# Patient Record
Sex: Male | Born: 1965 | Race: White | Hispanic: No | Marital: Married | Smoking: Never smoker
Health system: Southern US, Community
[De-identification: ages and names within clinical notes are randomized; demographics above are authoritative.]

## PROBLEM LIST (undated history)

## (undated) DIAGNOSIS — J302 Other seasonal allergic rhinitis: Secondary | ICD-10-CM

## (undated) DIAGNOSIS — R030 Elevated blood-pressure reading, without diagnosis of hypertension: Secondary | ICD-10-CM

## (undated) DIAGNOSIS — C4491 Basal cell carcinoma of skin, unspecified: Secondary | ICD-10-CM

## (undated) DIAGNOSIS — Z8619 Personal history of other infectious and parasitic diseases: Secondary | ICD-10-CM

## (undated) DIAGNOSIS — R51 Headache: Secondary | ICD-10-CM

## (undated) DIAGNOSIS — R519 Headache, unspecified: Secondary | ICD-10-CM

## (undated) DIAGNOSIS — K219 Gastro-esophageal reflux disease without esophagitis: Secondary | ICD-10-CM

## (undated) DIAGNOSIS — K648 Other hemorrhoids: Secondary | ICD-10-CM

## (undated) DIAGNOSIS — I509 Heart failure, unspecified: Secondary | ICD-10-CM

## (undated) DIAGNOSIS — I341 Nonrheumatic mitral (valve) prolapse: Secondary | ICD-10-CM

## (undated) DIAGNOSIS — C801 Malignant (primary) neoplasm, unspecified: Secondary | ICD-10-CM

## (undated) DIAGNOSIS — K5790 Diverticulosis of intestine, part unspecified, without perforation or abscess without bleeding: Secondary | ICD-10-CM

## (undated) DIAGNOSIS — M1990 Unspecified osteoarthritis, unspecified site: Secondary | ICD-10-CM

## (undated) HISTORY — DX: Elevated blood-pressure reading, without diagnosis of hypertension: R03.0

## (undated) HISTORY — DX: Headache, unspecified: R51.9

## (undated) HISTORY — DX: Nonrheumatic mitral (valve) prolapse: I34.1

## (undated) HISTORY — DX: Malignant (primary) neoplasm, unspecified: C80.1

## (undated) HISTORY — DX: Other hemorrhoids: K64.8

## (undated) HISTORY — DX: Gastro-esophageal reflux disease without esophagitis: K21.9

## (undated) HISTORY — DX: Diverticulosis of intestine, part unspecified, without perforation or abscess without bleeding: K57.90

## (undated) HISTORY — DX: Personal history of other infectious and parasitic diseases: Z86.19

## (undated) HISTORY — PX: COLONOSCOPY: SHX174

## (undated) HISTORY — PX: WISDOM TOOTH EXTRACTION: SHX21

## (undated) HISTORY — PX: EYE SURGERY: SHX253

## (undated) HISTORY — DX: Other seasonal allergic rhinitis: J30.2

## (undated) HISTORY — DX: Headache: R51

## (undated) HISTORY — DX: Unspecified osteoarthritis, unspecified site: M19.90

## (undated) HISTORY — DX: Basal cell carcinoma of skin, unspecified: C44.91

---

## 2015-05-30 HISTORY — PX: BASAL CELL CARCINOMA EXCISION: SHX1214

## 2017-06-25 ENCOUNTER — Other Ambulatory Visit: Payer: Self-pay

## 2017-06-25 ENCOUNTER — Ambulatory Visit: Payer: BLUE CROSS/BLUE SHIELD | Admitting: Physician Assistant

## 2017-06-25 ENCOUNTER — Encounter: Payer: Self-pay | Admitting: Physician Assistant

## 2017-06-25 ENCOUNTER — Telehealth: Payer: Self-pay | Admitting: Physician Assistant

## 2017-06-25 VITALS — BP 142/80 | HR 86 | Temp 97.8°F | Ht 72.05 in | Wt 244.0 lb

## 2017-06-25 DIAGNOSIS — J029 Acute pharyngitis, unspecified: Secondary | ICD-10-CM

## 2017-06-25 DIAGNOSIS — R6889 Other general symptoms and signs: Secondary | ICD-10-CM | POA: Diagnosis not present

## 2017-06-25 DIAGNOSIS — R05 Cough: Secondary | ICD-10-CM | POA: Diagnosis not present

## 2017-06-25 DIAGNOSIS — R509 Fever, unspecified: Secondary | ICD-10-CM | POA: Diagnosis not present

## 2017-06-25 DIAGNOSIS — R059 Cough, unspecified: Secondary | ICD-10-CM

## 2017-06-25 LAB — POC INFLUENZA A&B (BINAX/QUICKVUE)
Influenza A, POC: NEGATIVE
Influenza B, POC: NEGATIVE

## 2017-06-25 MED ORDER — BENZONATATE 100 MG PO CAPS: 100.0000 mg | ORAL_CAPSULE | Freq: Three times a day (TID) | ORAL | 0 refills | Status: DC | PRN

## 2017-06-25 NOTE — Telephone Encounter (Signed)
Contacted pt to make sure medication was sent to the correct pharmacy. Pt verified that medication was sent to the correct pharmacy. Notified pt that the pharmacy would be in contact when the medication was ready for pick up. Pt verbalized understanding.   [place] to confirm receipt of prescription. Pharmacy states that the prescription was just received.

## 2017-06-25 NOTE — Patient Instructions (Addendum)
Your in house flu test was negative but your symptoms are consistent with the flu. You are contagious until you are fever free for 24 hours without using tylenol or ibuprofen.I recommend continuing with your daily medication regimen for symptomatic relief. I have also given you a Rx for tessalon perlese to help with cough suppression. Two major complications after the flu are pneumonia and sinus infections. Please be aware of this and if you are not any better in 7-10 days or you develop worsening cough or sinus pressure, seek care at our clinic or the ED. Continue to wash your hands and wear a mask daily especially around other people.     Influenza, Adult Influenza ("the flu") is an infection in the lungs, nose, and throat (respiratory tract). It is caused by a virus. The flu causes many common cold symptoms, as well as a high fever and body aches. It can make you feel very sick. The flu spreads easily from person to person (is contagious). Getting a flu shot (influenza vaccination) every year is the best way to prevent the flu. Follow these instructions at home:  Take over-the-counter and prescription medicines only as told by your doctor.  Use a cool mist humidifier to add moisture (humidity) to the air in your home. This can make it easier to breathe.  Rest as needed.  Drink enough fluid to keep your pee (urine) clear or pale yellow.  Cover your mouth and nose when you cough or sneeze.  Wash your hands with soap and water often, especially after you cough or sneeze. If you cannot use soap and water, use hand sanitizer.  Stay home from work or school as told by your doctor. Unless you are visiting your doctor, try to avoid leaving home until your fever has been gone for 24 hours without the use of medicine.  Keep all follow-up visits as told by your doctor. This is important. How is this prevented?  Getting a yearly (annual) flu shot is the best way to avoid getting the flu. You may get  the flu shot in late summer, fall, or winter. Ask your doctor when you should get your flu shot.  Wash your hands often or use hand sanitizer often.  Avoid contact with people who are sick during cold and flu season.  Eat healthy foods.  Drink plenty of fluids.  Get enough sleep.  Exercise regularly. Contact a doctor if:  You get new symptoms.  You have: ? Chest pain. ? Watery poop (diarrhea). ? A fever.  Your cough gets worse.  You start to have more mucus.  You feel sick to your stomach (nauseous).  You throw up (vomit). Get help right away if:  You start to be short of breath or have trouble breathing.  Your skin or nails turn a bluish color.  You have very bad pain or stiffness in your neck.  You get a sudden headache.  You get sudden pain in your face or ear.  You cannot stop throwing up. This information is not intended to replace advice given to you by your health care provider. Make sure you discuss any questions you have with your health care provider. Document Released: 02/22/2008 Document Revised: 10/21/2015 Document Reviewed: 03/09/2015 Elsevier Interactive Patient Education  2017 [employer].   IF you received an x-ray today, you will receive an invoice from [facility]. Please contact [facility] at [phone] with questions or concerns regarding your invoice.   IF you received labwork today,  you will receive an invoice from [facility]. Please contact [facility] at [phone] with questions or concerns regarding your invoice.   Our billing staff will not be able to assist you with questions regarding bills from these companies.  You will be contacted with the lab results as soon as they are available. The fastest way to get your results is to activate your My Chart account. Instructions are located on the last page of this paperwork. If you have not heard from [place] regarding the results in 2 weeks, please contact this office.

## 2017-06-25 NOTE — Progress Notes (Signed)
MRN: [id number] DOB: [birth year]  Subjective:   Richard French is a 52 y.o. male presenting for chief complaint of Cough (FLU LIKE SYMPTOMS) .  Reports sudden onset 2 day history of severe generalized body aches, fever, dry cough, and sore throat.  Has been eating and drinking okay. Has tried tylenol, motrin, and cold and flu medication every 6-8 hours with relief. Notes the medication regimen he is doing is working very well for him.  Denies sinus pain, ear pain, wheezing, shortness of breath and chest pain, nausea, vomiting, abdominal pain and diarrhea. Has not had sick contact with anyone. Has history of seasonal allergies, no history of asthma, COPD, and diabetes. He does not have regular contact with the elderly or with young children. Patient has not had flu shot this season. Denies smoking. Denies any other aggravating or relieving factors, no other questions or concerns.  [place] has a current medication list which includes the following prescription(s): benzonatate. Also has no allergies on file.  Richard French  has a past medical history of GERD (gastroesophageal reflux disease) and Heart murmur. Also  has no past surgical history on file.   Objective:   Vitals: BP (!) 142/80 (BP Location: Left Arm, Patient Position: Sitting, Cuff Size: Normal)   Pulse 86   Temp 97.8 F (36.6 C) (Oral)   Ht 6' 0.05" (1.83 m)   Wt 244 lb (110.7 kg)   SpO2 96%   BMI 33.05 kg/m   Physical Exam  Constitutional: He is oriented to person, place, and time. He appears well-developed and well-nourished.  HENT:  Head: Normocephalic and atraumatic.  Right Ear: Tympanic membrane, external ear and ear canal normal.  Left Ear: Tympanic membrane, external ear and ear canal normal.  Nose: Nose normal. Right sinus exhibits no maxillary sinus tenderness and no frontal sinus tenderness. Left sinus exhibits no maxillary sinus tenderness and no frontal sinus tenderness.  Mouth/Throat: Uvula is midline and mucous  membranes are normal. Posterior oropharyngeal erythema present. Tonsils are 1+ on the right. Tonsils are 1+ on the left. No tonsillar exudate.  Eyes: Conjunctivae are normal.  Neck: Normal range of motion.  Cardiovascular: Normal rate, regular rhythm and normal heart sounds.  Pulmonary/Chest: Effort normal and breath sounds normal. He has no decreased breath sounds. He has no wheezes. He has no rhonchi. He has no rales.  Lymphadenopathy:       Head (right side): No submental, no submandibular, no tonsillar, no preauricular, no posterior auricular and no occipital adenopathy present.       Head (left side): No submental, no submandibular, no tonsillar, no preauricular, no posterior auricular and no occipital adenopathy present.    He has no cervical adenopathy.       Right: No supraclavicular adenopathy present.       Left: No supraclavicular adenopathy present.  Neurological: He is alert and oriented to person, place, and time.  Skin: Skin is warm and dry.  Psychiatric: He has a normal mood and affect.  Vitals reviewed.   Results for orders placed or performed in visit on 06/25/17 (from the past 24 hour(s))  POC Influenza A&B(BINAX/QUICKVUE)     Status: Normal   Collection Time: 06/25/17 11:34 AM  Result Value Ref Range   Influenza A, POC Negative Negative   Influenza B, POC Negative Negative    Assessment and Plan :  1. Flu-like symptoms - POC Influenza A&B(BINAX/QUICKVUE) 2. Fever, unspecified 3. Cough - benzonatate (TESSALON) 100 MG capsule; Take 1-2 capsules (100-200  mg total) by mouth 3 (three) times daily as needed for cough.  Dispense: 40 capsule; Refill: 0 4. Sore throat Point-of-care flu test negative.  Symptoms are consistent with the flu.  He is overall well-appearing, no distress. Vitals stable.  Lungs CTAB.  He is not considered high risk.  Therefore, recommend he continue with symptomatic treatment at this time as this is providing him with relief. Educated on potential  complications of of the flu.  Advised to return to clinic if symptoms worsen, do not improve in 7-10 days, or as needed.  Tenna Delaine, PA-C  [facility] 06/25/2017 11:36 AM

## 2017-06-25 NOTE — Telephone Encounter (Signed)
Copied from [place]. Topic: Quick Communication - See Telephone Encounter >> Jun 25, 2017 12:33 PM Vernona Rieger wrote: CRM for notification. See Telephone encounter for:   06/25/17.  Patient said the benzonatate (TESSALON) 100 MG capsule is not at the pharmacy.

## 2017-09-18 ENCOUNTER — Ambulatory Visit: Payer: Self-pay | Admitting: Family Medicine

## 2017-09-23 ENCOUNTER — Encounter

## 2017-11-05 ENCOUNTER — Encounter: Payer: Self-pay | Admitting: Internal Medicine

## 2017-11-05 ENCOUNTER — Encounter: Payer: Self-pay | Admitting: Family Medicine

## 2017-11-05 ENCOUNTER — Ambulatory Visit: Payer: BLUE CROSS/BLUE SHIELD | Admitting: Family Medicine

## 2017-11-05 VITALS — BP 132/98 | HR 88 | Temp 98.7°F | Ht 70.5 in | Wt 247.8 lb

## 2017-11-05 DIAGNOSIS — Z125 Encounter for screening for malignant neoplasm of prostate: Secondary | ICD-10-CM | POA: Diagnosis not present

## 2017-11-05 DIAGNOSIS — R609 Edema, unspecified: Secondary | ICD-10-CM | POA: Insufficient documentation

## 2017-11-05 DIAGNOSIS — R030 Elevated blood-pressure reading, without diagnosis of hypertension: Secondary | ICD-10-CM

## 2017-11-05 DIAGNOSIS — Z1211 Encounter for screening for malignant neoplasm of colon: Secondary | ICD-10-CM

## 2017-11-05 DIAGNOSIS — K219 Gastro-esophageal reflux disease without esophagitis: Secondary | ICD-10-CM | POA: Diagnosis not present

## 2017-11-05 DIAGNOSIS — Z23 Encounter for immunization: Secondary | ICD-10-CM | POA: Diagnosis not present

## 2017-11-05 DIAGNOSIS — I10 Essential (primary) hypertension: Secondary | ICD-10-CM | POA: Insufficient documentation

## 2017-11-05 LAB — COMPREHENSIVE METABOLIC PANEL
ALBUMIN: 4.6 g/dL (ref 3.5–5.2)
ALK PHOS: 67 U/L (ref 39–117)
ALT: 30 U/L (ref 0–53)
AST: 17 U/L (ref 0–37)
BILIRUBIN TOTAL: 0.4 mg/dL (ref 0.2–1.2)
BUN: 15 mg/dL (ref 6–23)
CALCIUM: 9.4 mg/dL (ref 8.4–10.5)
CO2: 23 mEq/L (ref 19–32)
CREATININE: 0.86 mg/dL (ref 0.40–1.50)
Chloride: 107 mEq/L (ref 96–112)
GFR: 100.12 mL/min (ref >60.00)
Glucose, Bld: 86 mg/dL (ref 70–99)
Potassium: 4.1 mEq/L (ref 3.5–5.1)
Sodium: 139 mEq/L (ref 135–145)
TOTAL PROTEIN: 7 g/dL (ref 6.0–8.3)

## 2017-11-05 LAB — PSA: PSA: 1.59 ng/mL (ref 0.10–4.00)

## 2017-11-05 LAB — BRAIN NATRIURETIC PEPTIDE: Pro B Natriuretic peptide (BNP): 17 pg/mL (ref 0.0–100.0)

## 2017-11-05 LAB — LIPID PANEL
CHOL/HDL RATIO: 5
CHOLESTEROL: 203 mg/dL — AB (ref 0–200)
HDL: 44.4 mg/dL (ref >39.00)
LDL Cholesterol: 135 mg/dL — ABNORMAL HIGH (ref 0–99)
NonHDL: 158.76
Triglycerides: 121 mg/dL (ref 0.0–149.0)
VLDL: 24.2 mg/dL (ref 0.0–40.0)

## 2017-11-05 LAB — TSH: TSH: 1.58 u[IU]/mL (ref 0.35–4.50)

## 2017-11-05 LAB — H. PYLORI ANTIBODY, IGG: H PYLORI IGG: NEGATIVE

## 2017-11-05 MED ORDER — HYDROCHLOROTHIAZIDE 12.5 MG PO CAPS: 12.5000 mg | ORAL_CAPSULE | Freq: Every day | ORAL | 2 refills | Status: DC
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
## 2017-11-05 NOTE — Assessment & Plan Note (Signed)
See below. Check labs today. HCTZ 12.5 mg daily started but likely will not need to continue this once he works on diet and lifestyle. The patient indicates understanding of these issues and agrees with the plan. Orders Placed This Encounter  Procedures  . Tdap vaccine greater than or equal to 50yo IM  . Comprehensive metabolic panel  . Lipid panel  . B Nat Peptide  . PSA  . H. pylori antibody, IgG  . TSH  . Ambulatory referral to Gastroenterology

## 2017-11-05 NOTE — Patient Instructions (Addendum)
Great to meet you.  We are starting HCTZ 12.5 mg daily.  I will call you with your lab results.  Keep me updated with blood pressures or come see me in 1 month.    DASH Eating Plan DASH stands for "Dietary Approaches to Stop Hypertension." The DASH eating plan is a healthy eating plan that has been shown to reduce high blood pressure (hypertension). It may also reduce your risk for type 2 diabetes, heart disease, and stroke. The DASH eating plan may also help with weight loss. What are tips for following this plan? General guidelines  Avoid eating more than 2,300 mg (milligrams) of salt (sodium) a day. If you have hypertension, you may need to reduce your sodium intake to 1,500 mg a day.  Limit alcohol intake to no more than 1 drink a day for nonpregnant women and 2 drinks a day for men. One drink equals 12 oz of beer, 5 oz of wine, or 1 oz of hard liquor.  Work with your health care provider to maintain a healthy body weight or to lose weight. Ask what an ideal weight is for you.  Get at least 30 minutes of exercise that causes your heart to beat faster (aerobic exercise) most days of the week. Activities may include walking, swimming, or biking.  Work with your health care provider or diet and nutrition specialist (dietitian) to adjust your eating plan to your individual calorie needs. Reading food labels  Check food labels for the amount of sodium per serving. Choose foods with less than 5 percent of the Daily Value of sodium. Generally, foods with less than 300 mg of sodium per serving fit into this eating plan.  To find whole grains, look for the word "whole" as the first word in the ingredient list. Shopping  Buy products labeled as "low-sodium" or "no salt added."  Buy fresh foods. Avoid canned foods and premade or frozen meals. Cooking  Avoid adding salt when cooking. Use salt-free seasonings or herbs instead of table salt or sea salt. Check with your health care provider  or pharmacist before using salt substitutes.  Do not fry foods. Cook foods using healthy methods such as baking, boiling, grilling, and broiling instead.  Cook with heart-healthy oils, such as olive, canola, soybean, or sunflower oil. Meal planning   Eat a balanced diet that includes: ? 5 or more servings of fruits and vegetables each day. At each meal, try to fill half of your plate with fruits and vegetables. ? Up to 6-8 servings of whole grains each day. ? Less than 6 oz of lean meat, poultry, or fish each day. A 3-oz serving of meat is about the same size as a deck of cards. One egg equals 1 oz. ? 2 servings of low-fat dairy each day. ? A serving of nuts, seeds, or beans 5 times each week. ? Heart-healthy fats. Healthy fats called Omega-3 fatty acids are found in foods such as flaxseeds and coldwater fish, like sardines, salmon, and mackerel.  Limit how much you eat of the following: ? Canned or prepackaged foods. ? Food that is high in trans fat, such as fried foods. ? Food that is high in saturated fat, such as fatty meat. ? Sweets, desserts, sugary drinks, and other foods with added sugar. ? Full-fat dairy products.  Do not salt foods before eating.  Try to eat at least 2 vegetarian meals each week.  Eat more home-cooked food and less restaurant, buffet, and fast food.  When eating at a restaurant, ask that your food be prepared with less salt or no salt, if possible. What foods are recommended? The items listed may not be a complete list. Talk with your dietitian about what dietary choices are best for you. Grains Whole-grain or whole-wheat bread. Whole-grain or whole-wheat pasta. Brown rice. [place]. Bulgur. Whole-grain and low-sodium cereals. Pita bread. Low-fat, low-sodium crackers. Whole-wheat flour tortillas. Vegetables Fresh or frozen vegetables (raw, steamed, roasted, or grilled). Low-sodium or reduced-sodium tomato and vegetable juice. Low-sodium or  reduced-sodium tomato sauce and tomato paste. Low-sodium or reduced-sodium canned vegetables. Fruits All fresh, dried, or frozen fruit. Canned fruit in natural juice (without added sugar). Meat and other protein foods Skinless chicken or [place]. Ground chicken or [place]. Pork with fat trimmed off. Fish and seafood. Egg whites. Dried beans, peas, or lentils. Unsalted nuts, nut butters, and seeds. Unsalted canned beans. Lean cuts of beef with fat trimmed off. Low-sodium, lean deli meat. Dairy Low-fat (1%) or fat-free (skim) milk. Fat-free, low-fat, or reduced-fat cheeses. Nonfat, low-sodium ricotta or cottage cheese. Low-fat or nonfat yogurt. Low-fat, low-sodium cheese. Fats and oils Soft margarine without trans fats. Vegetable oil. Low-fat, reduced-fat, or light mayonnaise and salad dressings (reduced-sodium). Canola, safflower, olive, soybean, and sunflower oils. Avocado. Seasoning and other foods Herbs. Spices. Seasoning mixes without salt. Unsalted popcorn and pretzels. Fat-free sweets. What foods are not recommended? The items listed may not be a complete list. Talk with your dietitian about what dietary choices are best for you. Grains Baked goods made with fat, such as croissants, muffins, or some breads. Dry pasta or rice meal packs. Vegetables Creamed or fried vegetables. Vegetables in a cheese sauce. Regular canned vegetables (not low-sodium or reduced-sodium). Regular canned tomato sauce and paste (not low-sodium or reduced-sodium). Regular tomato and vegetable juice (not low-sodium or reduced-sodium). Angie Fava. Olives. Fruits Canned fruit in a light or heavy syrup. Fried fruit. Fruit in cream or butter sauce. Meat and other protein foods Fatty cuts of meat. Ribs. Fried meat. Berniece Salines. Sausage. Bologna and other processed lunch meats. Salami. Fatback. Hotdogs. Bratwurst. Salted nuts and seeds. Canned beans with added salt. Canned or smoked fish. Whole eggs or egg yolks. Chicken or [place]  with skin. Dairy Whole or 2% milk, cream, and half-and-half. Whole or full-fat cream cheese. Whole-fat or sweetened yogurt. Full-fat cheese. Nondairy creamers. Whipped toppings. Processed cheese and cheese spreads. Fats and oils Butter. Stick margarine. Lard. Shortening. Ghee. Bacon fat. Tropical oils, such as coconut, palm kernel, or palm oil. Seasoning and other foods Salted popcorn and pretzels. Onion salt, garlic salt, seasoned salt, table salt, and sea salt. Worcestershire sauce. Tartar sauce. Barbecue sauce. Teriyaki sauce. Soy sauce, including reduced-sodium. Steak sauce. Canned and packaged gravies. Fish sauce. Oyster sauce. Cocktail sauce. Horseradish that you find on the shelf. Ketchup. Mustard. Meat flavorings and tenderizers. Bouillon cubes. Hot sauce and Tabasco sauce. Premade or packaged marinades. Premade or packaged taco seasonings. Relishes. Regular salad dressings. Where to find more information:  National Heart, Lung, and [place]: https://wilson-eaton.com/  American Heart Association: www.heart.org Summary  The DASH eating plan is a healthy eating plan that has been shown to reduce high blood pressure (hypertension). It may also reduce your risk for type 2 diabetes, heart disease, and stroke.  With the DASH eating plan, you should limit salt (sodium) intake to 2,300 mg a day. If you have hypertension, you may need to reduce your sodium intake to 1,500 mg a day.  When on the DASH eating plan,  aim to eat more fresh fruits and vegetables, whole grains, lean proteins, low-fat dairy, and heart-healthy fats.  Work with your health care provider or diet and nutrition specialist (dietitian) to adjust your eating plan to your individual calorie needs. This information is not intended to replace advice given to you by your health care provider. Make sure you discuss any questions you have with your health care provider. Document Released: 05/04/2011 Document Revised: 05/08/2016  Document Reviewed: 05/08/2016 Elsevier Interactive Patient Education  [employer].

## 2017-11-05 NOTE — Assessment & Plan Note (Signed)
Mildly persistently elevated with dependent edema. Had long discussion with pt.  He does want to control this with diet and exercise.  Given Dash diet and HCTZ 12.5 mg daily.  Likely will d/c this in several weeks as he changes his lifestyle.  Maudie Mercury will keep checking his BP at home. Follow up in 1 month. Labs today.

## 2017-11-05 NOTE — Assessment & Plan Note (Signed)
Discussed GERD friendly diet. H2 blocker as needed. Call or return to clinic prn if these symptoms worsen or fail to improve as anticipated. The patient indicates understanding of these issues and agrees with the plan.

## 2017-12-05 ENCOUNTER — Ambulatory Visit: Payer: BLUE CROSS/BLUE SHIELD | Admitting: Family Medicine

## 2017-12-06 ENCOUNTER — Ambulatory Visit (INDEPENDENT_AMBULATORY_CARE_PROVIDER_SITE_OTHER): Payer: BLUE CROSS/BLUE SHIELD | Admitting: Family Medicine

## 2017-12-06 VITALS — BP 126/84 | HR 109 | Temp 97.8°F | Ht 71.0 in | Wt 240.2 lb

## 2017-12-06 DIAGNOSIS — R030 Elevated blood-pressure reading, without diagnosis of hypertension: Secondary | ICD-10-CM

## 2017-12-06 NOTE — Patient Instructions (Signed)
Great to see you. Have Richard French please check your blood pressure over the several weeks.  Update me in the month or so.  I think we can stop taking the blood pressure medication.

## 2017-12-06 NOTE — Progress Notes (Signed)
Subjective:   Patient ID: Richard French, male    DOB: 04/24/1966, 52 y.o.   MRN: [id number]  Richard French is a pleasant 52 y.o. year old male who presents to clinic today with Follow-up (Patient is here today for a 62-month-F/U elevated BP.  At 6.10.19 OV pt expressed concern about BP slowly creaping up overtime.  He was started on HCTZ 12.5mg  and work on diet and lifestyle changes and keep track of BP and return in 1 month.  Labs were completed on 6.10.19. He has a visit scheduled with [facility] on 8.1.19.  He states that he has not been tracking his BP. He has lost 8 lbs since last visit.)  on 12/06/2017  HPI:  Follow up- When he established care with me on 11/05/17, he and his wife (an Therapist, sports), had expressed concern that his BP had been trending up over time.  We therefore started him on HCTZ 12.5 mg daily, advised and given handout about DASH diet, asked his wife to check his BP at home and advised him to follow up here today.  He has been following the diet.  Stopped drinking sodas all together,cut back on salt.  Did start running some.  He has actually lost 8 pounds since last OV. Wt Readings from Last 3 Encounters:  12/06/17 240 lb 3.2 oz (109 kg)  11/05/17 247 lb 12.8 oz (112.4 kg)  06/25/17 244 lb (110.7 kg)    Lab Results  Component Value Date   CREATININE 0.86 11/05/2017   No dizziness when he stands from a seated position.   Current Outpatient Medications on File Prior to Visit  Medication Sig Dispense Refill  . acetaminophen (TYLENOL) 500 MG tablet Take 500 mg by mouth every 6 (six) hours as needed.    . hydrochlorothiazide (MICROZIDE) 12.5 MG capsule Take 1 capsule (12.5 mg total) by mouth daily. 30 capsule 2  . ranitidine (ZANTAC) 150 MG capsule Take 150 mg by mouth daily.     No current facility-administered medications on file prior to visit.     No Known Allergies  Past Medical History:  Diagnosis Date  . GERD (gastroesophageal reflux disease)   . Heart murmur     . History of chicken pox    Had it twice    Past Surgical History:  Procedure Laterality Date  . BASAL CELL CARCINOMA EXCISION N/A 2017   From back    Family History  Problem Relation Age of Onset  . Hypertension Mother   . Hyperlipidemia Mother   . Anxiety disorder Mother   . Hypertension Father   . COPD Father   . Healthy Brother   . Hearing loss Maternal Grandfather   . Heart disease Maternal Grandfather   . COPD Paternal Grandmother   . Cancer Paternal Grandmother        Abdominal Cancer  . Heart disease Paternal Grandfather   . Cancer Paternal Aunt        Abdominal  . Colon cancer Paternal Uncle     Social History   Socioeconomic History  . Marital status: Married    Spouse name: Not on file  . Number of children: Not on file  . Years of education: Not on file  . Highest education level: Not on file  Occupational History  . Not on file  Social Needs  . Financial resource strain: Not on file  . Food insecurity:    Worry: Not on file    Inability: Not on file  .  Transportation needs:    Medical: Not on file    Non-medical: Not on file  Tobacco Use  . Smoking status: Never Smoker  . Smokeless tobacco: Never Used  Substance and Sexual Activity  . Alcohol use: Yes    Alcohol/week: 0.6 oz    Types: 1 Cans of beer per week  . Drug use: No  . Sexual activity: Yes  Lifestyle  . Physical activity:    Days per week: Not on file    Minutes per session: Not on file  . Stress: Not on file  Relationships  . Social connections:    Talks on phone: Not on file    Gets together: Not on file    Attends religious service: Not on file    Active member of club or organization: Not on file    Attends meetings of clubs or organizations: Not on file    Relationship status: Not on file  . Intimate partner violence:    Fear of current or ex partner: Not on file    Emotionally abused: Not on file    Physically abused: Not on file    Forced sexual activity: Not on  file  Other Topics Concern  . Not on file  Social History Narrative  . Not on file   The PMH, PSH, Social History, Family History, Medications, and allergies have been reviewed in [facility], and have been updated if relevant.   Review of Systems  Constitutional: Negative.   Respiratory: Negative.   Cardiovascular: Negative.   Musculoskeletal: Negative.   Psychiatric/Behavioral: Negative.   All other systems reviewed and are negative.      Objective:    BP 126/84 (BP Location: Left Arm, Cuff Size: Normal)   Pulse (!) 109   Temp 97.8 F (36.6 C) (Oral)   Ht 5\' 11"  (1.803 m)   Wt 240 lb 3.2 oz (109 kg)   SpO2 97%   BMI 33.50 kg/m    Physical Exam  Constitutional: He is oriented to person, place, and time. He appears well-developed and well-nourished. No distress.  HENT:  Head: Normocephalic and atraumatic.  Eyes: EOM are normal.  Neck: Normal range of motion.  Cardiovascular: Normal rate and regular rhythm.  Pulmonary/Chest: Effort normal and breath sounds normal.  Musculoskeletal: Normal range of motion. He exhibits no edema.  Neurological: He is alert and oriented to person, place, and time. No cranial nerve deficit.  Skin: Skin is warm and dry. He is not diaphoretic.  Psychiatric: He has a normal mood and affect. His behavior is normal. Judgment and thought content normal.  Nursing note and vitals reviewed.         Assessment & Plan:   Elevated blood-pressure reading without diagnosis of hypertension No follow-ups on file.

## 2017-12-06 NOTE — Assessment & Plan Note (Signed)
Improved with diet and HCTZ. He will check blood pressures at home, update me with readings and d/c HCTZ. He will update me in 1 mont.

## 2017-12-27 ENCOUNTER — Encounter: Payer: Self-pay | Admitting: *Deleted

## 2017-12-27 NOTE — Telephone Encounter (Signed)
Erroneous encounter

## 2018-01-11 ENCOUNTER — Encounter: Payer: BLUE CROSS/BLUE SHIELD | Admitting: Internal Medicine

## 2018-01-16 ENCOUNTER — Ambulatory Visit: Payer: Self-pay | Admitting: Family Medicine

## 2018-01-24 ENCOUNTER — Ambulatory Visit (AMBULATORY_SURGERY_CENTER): Payer: Self-pay | Admitting: *Deleted

## 2018-01-24 ENCOUNTER — Other Ambulatory Visit: Payer: Self-pay

## 2018-01-24 VITALS — Ht 70.0 in | Wt 241.8 lb

## 2018-01-24 DIAGNOSIS — Z1211 Encounter for screening for malignant neoplasm of colon: Secondary | ICD-10-CM

## 2018-01-24 MED ORDER — SUPREP BOWEL PREP KIT 17.5-3.13-1.6 GM/177ML PO SOLN: 1.0000 | Freq: Once | ORAL | 0 refills | Status: AC

## 2018-01-24 NOTE — Progress Notes (Signed)
Patient denies any allergies to egg or soy products. Patient denies complications with anesthesia/sedation.  Patient denies oxygen use at home and denies diet medications. Pamphlet given on colonoscopy. 

## 2018-02-11 ENCOUNTER — Encounter: Payer: BLUE CROSS/BLUE SHIELD | Admitting: Internal Medicine

## 2018-03-06 ENCOUNTER — Encounter: Payer: Self-pay | Admitting: Internal Medicine

## 2018-03-20 ENCOUNTER — Encounter: Payer: Self-pay | Admitting: Internal Medicine

## 2018-03-20 ENCOUNTER — Ambulatory Visit (AMBULATORY_SURGERY_CENTER): Payer: BLUE CROSS/BLUE SHIELD | Admitting: Internal Medicine

## 2018-03-20 VITALS — BP 104/64 | HR 58 | Temp 98.4°F | Resp 12 | Ht 70.0 in | Wt 241.0 lb

## 2018-03-20 DIAGNOSIS — D122 Benign neoplasm of ascending colon: Secondary | ICD-10-CM

## 2018-03-20 DIAGNOSIS — Z1211 Encounter for screening for malignant neoplasm of colon: Secondary | ICD-10-CM

## 2018-03-20 DIAGNOSIS — K635 Polyp of colon: Secondary | ICD-10-CM

## 2018-03-20 DIAGNOSIS — D123 Benign neoplasm of transverse colon: Secondary | ICD-10-CM

## 2018-03-20 MED ORDER — PANTOPRAZOLE SODIUM 40 MG PO TBEC: 40.0000 mg | DELAYED_RELEASE_TABLET | Freq: Every day | ORAL | 3 refills | Status: DC

## 2018-03-20 MED ORDER — SODIUM CHLORIDE 0.9 % IV SOLN: 500.0000 mL | Freq: Once | INTRAVENOUS | Status: DC

## 2018-03-20 NOTE — Patient Instructions (Signed)

## 2018-03-20 NOTE — Progress Notes (Signed)
Report given to PACU, vss 

## 2018-03-20 NOTE — Progress Notes (Signed)
Called to room to assist during endoscopic procedure.  Patient ID and intended procedure confirmed with present staff. Received instructions for my participation in the procedure from the performing physician.  

## 2018-03-20 NOTE — Progress Notes (Signed)
Pt's states no medical or surgical changes since previsit or office visit. 

## 2018-03-20 NOTE — Op Note (Signed)
[facility] Patient Name: Richard French Procedure Date: 03/20/2018 2:25 PM MRN: [id number] Endoscopist: Jerene Bears , MD Age: 52 Referring MD:  Date of Birth: 03/30/1966 Gender: Male Account #: [id number] Procedure:                Colonoscopy Indications:              Screening for colorectal malignant neoplasm, This                            is the patient's first colonoscopy Medicines:                Monitored Anesthesia Care Procedure:                Pre-Anesthesia Assessment:                           - Prior to the procedure, a History and Physical                            was performed, and patient medications and                            allergies were reviewed. The patient's tolerance of                            previous anesthesia was also reviewed. The risks                            and benefits of the procedure and the sedation                            options and risks were discussed with the patient.                            All questions were answered, and informed consent                            was obtained. Prior Anticoagulants: The patient has                            taken no previous anticoagulant or antiplatelet                            agents. ASA Grade Assessment: II - A patient with                            mild systemic disease. After reviewing the risks                            and benefits, the patient was deemed in                            satisfactory condition to undergo the procedure.  After obtaining informed consent, the colonoscope                            was passed under direct vision. Throughout the                            procedure, the patient's blood pressure, pulse, and                            oxygen saturations were monitored continuously. The                            Colonoscope was introduced through the anus and                            advanced to the cecum,  identified by appendiceal                            orifice and ileocecal valve. The colonoscopy was                            performed without difficulty. The patient tolerated                            the procedure well. The quality of the bowel                            preparation was good. The ileocecal valve,                            appendiceal orifice, and rectum were photographed. Scope In: 2:34:12 PM Scope Out: 2:48:11 PM Scope Withdrawal Time: 0 hours 12 minutes 30 seconds  Total Procedure Duration: 0 hours 13 minutes 59 seconds  Findings:                 The digital rectal exam was normal.                           Four sessile polyps were found in the ascending                            colon. The polyps were 3 to 6 mm in size. These                            polyps were removed with a cold snare. Resection                            and retrieval were complete.                           Two sessile polyps were found in the transverse                            colon. The polyps were 4 to  5 mm in size. These                            polyps were removed with a cold snare. Resection                            and retrieval were complete.                           Multiple small and large-mouthed diverticula were                            found in the sigmoid colon and descending colon.                            Erythema was seen in association with the                            diverticular opening. Biopsies were taken with a                            cold forceps for histology to evaluate for SCAD.                           Internal hemorrhoids were found during                            retroflexion. The hemorrhoids were small. Complications:            No immediate complications. Estimated Blood Loss:     Estimated blood loss was minimal. Impression:               - Four 3 to 6 mm polyps in the ascending colon,                            removed with a cold  snare. Resected and retrieved.                           - Two 4 to 5 mm polyps in the transverse colon,                            removed with a cold snare. Resected and retrieved.                           - Moderate diverticulosis in the sigmoid colon and                            in the descending colon. Erythema was seen in                            association with the diverticular opening. Biopsied.                           - Small internal hemorrhoids. Recommendation:           -  Patient has a contact number available for                            emergencies. The signs and symptoms of potential                            delayed complications were discussed with the                            patient. Return to normal activities tomorrow.                            Written discharge instructions were provided to the                            patient.                           - Resume previous diet.                           - Continue present medications.                           - Await pathology results.                           - Repeat colonoscopy is recommended. The                            colonoscopy date will be determined after pathology                            results from today's exam become available for                            review. Jerene Bears, MD 03/20/2018 2:54:24 PM This report has been signed electronically.

## 2018-03-21 ENCOUNTER — Telehealth: Payer: Self-pay

## 2018-03-21 NOTE — Telephone Encounter (Signed)
  Follow up Call-  Call back number 03/20/2018  Post procedure Call Back phone  # [phone]  Phone comments ok to speak to spouse  Permission to leave phone message Yes     Patient questions:  Do you have a fever, pain , or abdominal swelling? No. Pain Score  0 *  Have you tolerated food without any problems? Yes.    Have you been able to return to your normal activities? Yes.    Do you have any questions about your discharge instructions: Diet   No. Medications  No. Follow up visit  No.  Do you have questions or concerns about your Care? No.  Actions: * If pain score is 4 or above: No action needed, pain <4.

## 2018-03-25 ENCOUNTER — Encounter: Payer: Self-pay | Admitting: Internal Medicine

## 2019-02-19 DIAGNOSIS — G5621 Lesion of ulnar nerve, right upper limb: Secondary | ICD-10-CM | POA: Insufficient documentation

## 2019-02-19 DIAGNOSIS — M25641 Stiffness of right hand, not elsewhere classified: Secondary | ICD-10-CM | POA: Insufficient documentation

## 2019-02-19 DIAGNOSIS — G5622 Lesion of ulnar nerve, left upper limb: Secondary | ICD-10-CM | POA: Insufficient documentation

## 2019-02-19 DIAGNOSIS — G5601 Carpal tunnel syndrome, right upper limb: Secondary | ICD-10-CM | POA: Insufficient documentation

## 2019-02-19 DIAGNOSIS — M25642 Stiffness of left hand, not elsewhere classified: Secondary | ICD-10-CM | POA: Insufficient documentation

## 2019-03-22 ENCOUNTER — Other Ambulatory Visit: Payer: Self-pay | Admitting: Internal Medicine

## 2019-03-31 ENCOUNTER — Other Ambulatory Visit: Payer: Self-pay | Admitting: Family Medicine

## 2019-03-31 ENCOUNTER — Other Ambulatory Visit: Payer: Self-pay

## 2019-03-31 DIAGNOSIS — Z20822 Contact with and (suspected) exposure to covid-19: Secondary | ICD-10-CM

## 2019-04-02 LAB — NOVEL CORONAVIRUS, NAA: SARS-CoV-2, NAA: NOT DETECTED

## 2019-04-07 ENCOUNTER — Other Ambulatory Visit: Payer: Self-pay | Admitting: Family Medicine

## 2019-04-07 MED ORDER — PANTOPRAZOLE SODIUM 40 MG PO TBEC: 40.0000 mg | DELAYED_RELEASE_TABLET | Freq: Two times a day (BID) | ORAL | 3 refills | Status: DC

## 2019-04-08 DIAGNOSIS — G609 Hereditary and idiopathic neuropathy, unspecified: Secondary | ICD-10-CM | POA: Insufficient documentation

## 2019-04-16 ENCOUNTER — Encounter: Payer: Self-pay | Admitting: Neurology

## 2019-04-16 ENCOUNTER — Other Ambulatory Visit: Payer: Self-pay

## 2019-04-16 DIAGNOSIS — R202 Paresthesia of skin: Secondary | ICD-10-CM

## 2019-04-22 ENCOUNTER — Other Ambulatory Visit: Payer: Self-pay

## 2019-04-23 ENCOUNTER — Other Ambulatory Visit: Payer: Self-pay

## 2019-04-23 ENCOUNTER — Ambulatory Visit (INDEPENDENT_AMBULATORY_CARE_PROVIDER_SITE_OTHER): Payer: Managed Care, Other (non HMO) | Admitting: Neurology

## 2019-04-23 DIAGNOSIS — G5622 Lesion of ulnar nerve, left upper limb: Secondary | ICD-10-CM

## 2019-04-23 DIAGNOSIS — R202 Paresthesia of skin: Secondary | ICD-10-CM

## 2019-04-23 NOTE — Procedures (Signed)
[facility]  [address]  [place], [place] [address] Tel: [phone] Fax:  [phone] Test Date:  04/23/2019  Patient: Richard French DOB: 11-13-1965 Physician: Narda Amber, DO  Sex: Male Height: 5\' 10"  Ref Phys: Leanora Cover, MD  ID#: [id number] Temp: 33.0C Technician:    Patient Complaints: This is a 53 year old man referred for evaluation of bilateral hand paresthesias concerning for entrapment neuropathy.  NCV & EMG Findings: Extensive electrodiagnostic testing of the right upper extremity and additional studies of the left shows:  1. Bilateral median, mixed palmar, and right ulnar sensory responses are within normal limits.  Left ulnar sensory response shows prolonged latency (3.6 ms). 2. Left ulnar motor response shows slowed conduction velocity across the elbow (A Elbow-B Elbow, 48 m/s).  Bilateral median and right ulnar motor responses are within normal limits.   3. Chronic motor axonal loss changes are isolated to the left abductor digiti minimi and flexor carpi ulnaris muscles, without accompanied active denervation.    Impression: 1. Left ulnar neuropathy with slowing across the elbow, purely demyelinating, mild. 2. There is no evidence of a right ulnar neuropathy or carpal tunnel syndrome affecting the upper extremities.   ___________________________ Narda Amber, DO    Nerve Conduction Studies Anti Sensory Summary Table   Site NR Peak (ms) Norm Peak (ms) P-T Amp (V) Norm P-T Amp  Left Median Anti Sensory (2nd Digit)  33C  Wrist    3.6 <3.6 39.7 >15  Right Median Anti Sensory (2nd Digit)  33C  Wrist    3.3 <3.6 32.3 >15  Left Ulnar Anti Sensory (5th Digit)  33C  Wrist    3.6 <3.1 28.8 >10  Right Ulnar Anti Sensory (5th Digit)  33C  Wrist    2.8 <3.1 28.4 >10   Motor Summary Table   Site NR Onset (ms) Norm Onset (ms) O-P Amp (mV) Norm O-P Amp Site1 Site2 Delta-0 (ms) Dist (cm) Vel (m/s) Norm Vel (m/s)  Left Median Motor (Abd  Poll Brev)  33C  Wrist    3.2 <4.0 8.5 >6 Elbow Wrist 5.6 32.0 57 >50  Elbow    8.8  8.0         Right Median Motor (Abd Poll Brev)  33C  Wrist    2.9 <4.0 8.0 >6 Elbow Wrist 5.2 31.0 60 >50  Elbow    8.1  7.6         Left Ulnar Motor (Abd Dig Minimi)  33C  Wrist    2.8 <3.1 11.1 >7 B Elbow Wrist 4.2 25.0 60 >50  B Elbow    7.0  10.0  A Elbow B Elbow 2.1 10.0 48 >50  A Elbow    9.1  9.7         Right Ulnar Motor (Abd Dig Minimi)  33C  Wrist    2.4 <3.1 11.9 >7 B Elbow Wrist 5.1 27.0 53 >50  B Elbow    7.5  11.6  A Elbow B Elbow 1.4 9.0 64 >50  A Elbow    8.9  11.3          Comparison Summary Table   Site NR Peak (ms) Norm Peak (ms) P-T Amp (V) Site1 Site2 Delta-P (ms) Norm Delta (ms)  Left Median/Ulnar Palm Comparison (Wrist - 8cm)  33C  Median Palm    1.8 <2.2 52.9 Median Palm Ulnar Palm 0.1   Ulnar Palm    1.9 <2.2 17.5      Right  Median/Ulnar Palm Comparison (Wrist - 8cm)  33C  Median Palm    1.7 <2.2 72.0 Median Palm Ulnar Palm 0.1   Ulnar Palm    1.8 <2.2 14.1       EMG   Side Muscle Ins Act Fibs Psw Fasc Number Recrt Dur Dur. Amp Amp. Poly Poly. Comment  Right 1stDorInt Nml Nml Nml Nml Nml Nml Nml Nml Nml Nml Nml Nml N/A  Right PronatorTeres Nml Nml Nml Nml Nml Nml Nml Nml Nml Nml Nml Nml N/A  Right Biceps Nml Nml Nml Nml Nml Nml Nml Nml Nml Nml Nml Nml N/A  Right Triceps Nml Nml Nml Nml Nml Nml Nml Nml Nml Nml Nml Nml N/A  Right Deltoid Nml Nml Nml Nml Nml Nml Nml Nml Nml Nml Nml Nml N/A  Left 1stDorInt Nml Nml Nml Nml Nml Nml Nml Nml Nml Nml Nml Nml N/A  Left PronatorTeres Nml Nml Nml Nml Nml Nml Nml Nml Nml Nml Nml Nml N/A  Left Biceps Nml Nml Nml Nml Nml Nml Nml Nml Nml Nml Nml Nml N/A  Left Triceps Nml Nml Nml Nml Nml Nml Nml Nml Nml Nml Nml Nml N/A  Left Deltoid Nml Nml Nml Nml Nml Nml Nml Nml Nml Nml Nml Nml N/A  Left ABD Dig Min Nml Nml Nml Nml 1- Rapid Few 1+ Few 1+ Nml Nml N/A  Left FlexCarpiUln Nml Nml Nml Nml 1- Rapid Few 1+ Few 1+ Nml Nml N/A       Waveforms:

## 2019-08-14 ENCOUNTER — Ambulatory Visit: Payer: Managed Care, Other (non HMO) | Attending: Internal Medicine

## 2019-08-14 DIAGNOSIS — Z23 Encounter for immunization: Secondary | ICD-10-CM

## 2019-08-14 NOTE — Progress Notes (Signed)
   Covid-19 Vaccination Clinic  Name:  Richard French    MRN: [id number] DOB: 1965/08/17  08/14/2019  [name] was observed post Covid-19 immunization for 15 minutes without incident. He was provided with Vaccine Information Sheet and instruction to access the V-Safe system.   [name] was instructed to call 911 with any severe reactions post vaccine: Marland Kitchen Difficulty breathing  . Swelling of face and throat  . A fast heartbeat  . A bad rash all over body  . Dizziness and weakness   Immunizations Administered    Name Date Dose VIS Date Route   Pfizer COVID-19 Vaccine 08/14/2019  9:45 AM 0.3 mL 05/09/2019 Intramuscular   Manufacturer: [place]   Lot: [id number]   [place]: [id number]

## 2019-09-08 ENCOUNTER — Ambulatory Visit: Payer: Managed Care, Other (non HMO) | Attending: Internal Medicine

## 2019-09-08 DIAGNOSIS — Z23 Encounter for immunization: Secondary | ICD-10-CM

## 2019-09-08 NOTE — Progress Notes (Signed)
   Covid-19 Vaccination Clinic  Name:  Richard French    MRN: [id number] DOB: 12/11/1965  09/08/2019  Richard French was observed post Covid-19 immunization for 15 minutes without incident. He was provided with Vaccine Information Sheet and instruction to access the V-Safe system.   Richard French was instructed to call 911 with any severe reactions post vaccine: Marland Kitchen Difficulty breathing  . Swelling of face and throat  . A fast heartbeat  . A bad rash all over body  . Dizziness and weakness   Immunizations Administered    Name Date Dose VIS Date Route   Pfizer COVID-19 Vaccine 09/08/2019  9:06 AM 0.3 mL 05/09/2019 Intramuscular   Manufacturer: [place]   Lot: [id number]   [place]: [id number]

## 2019-12-17 ENCOUNTER — Telehealth (HOSPITAL_COMMUNITY): Payer: Self-pay

## 2019-12-17 NOTE — Telephone Encounter (Signed)
Left message to return call 

## 2019-12-17 NOTE — Telephone Encounter (Signed)
-----   Message from Larey Dresser, MD sent at 12/17/2019  3:20 PM EDT ----- Please add this patient on as new patient for me at 3:40 on Friday if he can come at that time.  Thanks.

## 2019-12-18 ENCOUNTER — Telehealth (HOSPITAL_COMMUNITY): Payer: Self-pay | Admitting: *Deleted

## 2019-12-18 NOTE — Telephone Encounter (Signed)
-----   Message from [place], [place] sent at 12/17/2019  3:24 PM EDT ----- Left message to return call  ----- Message ----- From: Larey Dresser, MD Sent: 12/17/2019   3:20 PM EDT To: Pollyann Glen, CMA, #  Please add this patient on as new patient for me at 3:40 on Friday if he can come at that time.  Thanks.

## 2019-12-22 ENCOUNTER — Ambulatory Visit (HOSPITAL_COMMUNITY)
Admission: RE | Admit: 2019-12-22 | Discharge: 2019-12-22 | Disposition: A | Discharge status: Home / Self Care | Payer: Managed Care, Other (non HMO) | Source: Ambulatory Visit | Attending: Cardiology | Admitting: Cardiology

## 2019-12-22 ENCOUNTER — Other Ambulatory Visit: Payer: Self-pay

## 2019-12-22 ENCOUNTER — Encounter (HOSPITAL_COMMUNITY): Payer: Self-pay | Admitting: Cardiology

## 2019-12-22 VITALS — BP 160/110 | HR 94 | Wt 248.0 lb

## 2019-12-22 DIAGNOSIS — Z7982 Long term (current) use of aspirin: Secondary | ICD-10-CM | POA: Insufficient documentation

## 2019-12-22 DIAGNOSIS — E785 Hyperlipidemia, unspecified: Secondary | ICD-10-CM | POA: Diagnosis not present

## 2019-12-22 DIAGNOSIS — R079 Chest pain, unspecified: Secondary | ICD-10-CM | POA: Insufficient documentation

## 2019-12-22 DIAGNOSIS — Z79899 Other long term (current) drug therapy: Secondary | ICD-10-CM | POA: Insufficient documentation

## 2019-12-22 DIAGNOSIS — I341 Nonrheumatic mitral (valve) prolapse: Secondary | ICD-10-CM | POA: Diagnosis not present

## 2019-12-22 DIAGNOSIS — R030 Elevated blood-pressure reading, without diagnosis of hypertension: Secondary | ICD-10-CM

## 2019-12-22 DIAGNOSIS — Z791 Long term (current) use of non-steroidal anti-inflammatories (NSAID): Secondary | ICD-10-CM | POA: Diagnosis not present

## 2019-12-22 DIAGNOSIS — I10 Essential (primary) hypertension: Secondary | ICD-10-CM | POA: Insufficient documentation

## 2019-12-22 DIAGNOSIS — Z8249 Family history of ischemic heart disease and other diseases of the circulatory system: Secondary | ICD-10-CM | POA: Insufficient documentation

## 2019-12-22 DIAGNOSIS — K219 Gastro-esophageal reflux disease without esophagitis: Secondary | ICD-10-CM | POA: Insufficient documentation

## 2019-12-22 LAB — CBC
HCT: 46.7 % (ref 39.0–52.0)
Hemoglobin: 16 g/dL (ref 13.0–17.0)
MCH: 29.7 pg (ref 26.0–34.0)
MCHC: 34.3 g/dL (ref 30.0–36.0)
MCV: 86.8 fL (ref 80.0–100.0)
Platelets: 303 10*3/uL (ref 150–400)
RBC: 5.38 MIL/uL (ref 4.22–5.81)
RDW: 12.7 % (ref 11.5–15.5)
WBC: 11.7 10*3/uL — ABNORMAL HIGH (ref 4.0–10.5)
nRBC: 0 % (ref 0.0–0.2)

## 2019-12-22 LAB — BASIC METABOLIC PANEL
Anion gap: 9 (ref 5–15)
BUN: 12 mg/dL (ref 6–20)
CO2: 23 mmol/L (ref 22–32)
Calcium: 9.2 mg/dL (ref 8.9–10.3)
Chloride: 108 mmol/L (ref 98–111)
Creatinine, Ser: 1.04 mg/dL (ref 0.61–1.24)
GFR calc Af Amer: >60 mL/min (ref >60)
GFR calc non Af Amer: >60 mL/min (ref >60)
Glucose, Bld: 85 mg/dL (ref 70–99)
Potassium: 4 mmol/L (ref 3.5–5.1)
Sodium: 140 mmol/L (ref 135–145)

## 2019-12-22 LAB — LIPID PANEL
Cholesterol: 239 mg/dL — ABNORMAL HIGH (ref 0–200)
HDL: 53 mg/dL (ref >40)
LDL Cholesterol: 164 mg/dL — ABNORMAL HIGH (ref 0–99)
Total CHOL/HDL Ratio: 4.5 RATIO
Triglycerides: 108 mg/dL (ref <150)
VLDL: 22 mg/dL (ref 0–40)

## 2019-12-22 MED ORDER — LISINOPRIL-HYDROCHLOROTHIAZIDE 20-12.5 MG PO TABS: 1.0000 | ORAL_TABLET | Freq: Every day | ORAL | 6 refills | Status: DC

## 2019-12-22 MED ORDER — METOPROLOL SUCCINATE ER 50 MG PO TB24
50.0000 mg | ORAL_TABLET | Freq: Once | ORAL | 0 refills | Status: DC
Start: 2019-12-22 — End: 2020-01-20

## 2019-12-22 NOTE — Patient Instructions (Addendum)
Start Lisinopril/HCTZ 20/12.5 mg DAILY  Labs done today, your results will be available in MyChart, we will contact you for abnormal readings.  Repeat labs in: 10-14 days  Please follow up with our heart failure pharmacist in 3-4 weeks  Your physician recommends that you schedule a follow-up appointment in: 3 months  Decrease Caffeine intake, daily maximum is 400mg   Your physician has requested that you regularly monitor and record your blood pressure readings at home. Please use the same machine at the same time of day to check your readings and record them to bring to your follow-up visit.  Your physician has requested that you have cardiac CT. Cardiac computed tomography (CT) is a painless test that uses an x-ray machine to take clear, detailed pictures of your heart. For further information please visit [email or web address]. Please follow instruction sheet as given. ONCE APPROVED BY YOUR INSURANCE COMPANY YOU WILL BE CONTACTED TO SCHEDULE THIS   CARDIAC CT INSTRUCTIONS  Please arrive at the [facility] main entrance of [facility] at xx:xx AM (30-45 minutes prior to test start time)  Proceed to the [facility] Radiology Department (First Floor).  Please follow these instructions carefully (unless otherwise directed):  Hold all erectile dysfunction medications at least 48 hours prior to test.  On the Night Before the Test: . Be sure to Drink plenty of water, but do not exceed your fluid restriction limit . Do not consume any caffeinated/decaffeinated beverages or chocolate 12 hours prior to your test.   On the Day of the Test: . Drink plenty of water. Do not drink any water within one hour of the test. . Do not eat any food 4 hours prior to the test. . You may take your regular medications prior to the test.  . Take metoprolol (Lopressor) 50 MG 1 hour prior to test.   If you have any questions or concerns before your next appointment please send [place] a message through  [place] or call our office at [phone].    TO LEAVE A MESSAGE FOR THE NURSE SELECT OPTION 2, PLEASE LEAVE A MESSAGE INCLUDING: . YOUR NAME . DATE OF BIRTH . CALL BACK NUMBER . REASON FOR CALL**this is important as we prioritize the call backs  [place] AS LONG AS YOU CALL BEFORE 4:00 PM  At the [facility], you and your health needs are our priority. As part of our continuing mission to provide you with exceptional heart care, we have created designated Provider Care Teams. These Care Teams include your primary Cardiologist (physician) and Advanced Practice Providers (APPs- Physician Assistants and Nurse Practitioners) who all work together to provide you with the care you need, when you need it.   You may see any of the following providers on your designated Care Team at your next follow up: Marland Kitchen Dr Glori Bickers . Dr Loralie Champagne . Darrick Grinder, NP . Lyda Jester, PA . Audry Riles, PharmD   Please be sure to bring in all your medications bottles to every appointment.

## 2019-12-23 NOTE — Progress Notes (Signed)
PCP: Formerly Dr. Deborra Medina Cardiology: Dr. Aundra Dubin  54 y.o. with history of HTN and mitral valve prolapse was referred by Dr. Coralie Keens for evaluation of chest pain.  Patient has no personal cardiac history.  His grandfather died at 71 with MI.  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  He drinks 4-5 cups of coffee/day.  Rare ETOH, no smoking.  He is not currently on any meds for BP control.  He days Excedrin for daily headaches (mild) x years.  BP is very high today at 160/110, runs 140s/100s at home when checked by his wife.   Last week, he developed a flushing sensation all over his body and then chest pressure/tightness centrally that lasted for about 20 minutes then spontaneously resolved.  He does not get exertional chest pain.  He has not had a recurrence.  He is short of breath with heavier activity like walking fast up stairs.  +Generalized fatigue.  Occasional short palpitations.  No syncope.    ECG (personally reviewed): NSR, normal  PMH: 1. HTN 2. GERD 3. H/o mitral valve prolapse diagnosed remotely.  4. Chronic headaches  SH: Married, Financial planner, nonsmoker, rare ETOH.   FH: Father with COPD, colon cancer.  Grandfather with MI at 14.   ROS: All systems reviewed and negative except as per HPI.   Current Outpatient Medications  Medication Sig Dispense Refill   acetaminophen (TYLENOL) 500 MG tablet Take 500 mg by mouth every 6 (six) hours as needed.     Aspirin-Acetaminophen-Caffeine (EXCEDRIN EXTRA STRENGTH PO) Take 1 tablet by mouth daily. Foe headaches     ibuprofen (MOTRIN IB) 200 MG tablet Motrin IB 200 mg tablet     ranitidine (ZANTAC) 150 MG capsule Take 150 mg by mouth daily.     lisinopril-hydrochlorothiazide (ZESTORETIC) 20-12.5 MG tablet Take 1 tablet by mouth daily. 30 tablet 6   metoprolol succinate (TOPROL-XL) 50 MG 24 hr tablet Take 1 tablet (50 mg total) by mouth once for 1 dose. 1 hour before cardiac CTA 1  tablet 0   Current Facility-Administered Medications  Medication Dose Route Frequency Provider Last Rate Last Admin   0.9 %  sodium chloride infusion  500 mL Intravenous Once Pyrtle, Lajuan Lines, MD       BP (!) 160/110    Pulse 94    Wt (!) 112.5 kg (248 lb)    SpO2 98%    BMI 35.58 kg/m  General: NAD Neck: No JVD, no thyromegaly or thyroid nodule.  Lungs: Clear to auscultation bilaterally with normal respiratory effort. CV: Nondisplaced PMI.  Heart regular S1/S2, no L9/J5, no murmur or click.  No peripheral edema.  No carotid bruit.  Normal pedal pulses.  Abdomen: Soft, nontender, no hepatosplenomegaly, no distention.  Skin: Intact without lesions or rashes.  Neurologic: Alert and oriented x 3.  Psych: Normal affect. Extremities: No clubbing or cyanosis.  HEENT: Normal.   Assessment/Plan: 1. HTN: Uncontrolled.  He is not on any medications currently.  - Start lisinopril/HCTZ 20/12.5 daily.  BMET today and in 10 days. - Work on weight loss => diet, exercise.  - Keep ETOH minimal.  - Check BP daily after starting lisinopril/HCTZ.  - Check serum metanephrines to screen for pheochromocytoma given flushing episode.  2. Mitral valve prolapse: Patient was told in the past that he has this.  He does not have a click or murmur on exam, think mitral valve prolapse is unlikely.  3. OSA: Suspected, daytime fatigue.  - I will arrange for home sleep study.  4. Chest pain: Patient had an episode of prolonged CP last week.  He has RFs for CAD, including age/gender and HTN.  - I will arrange for coronary CTA to look for significant coronary disease.  - Start ASA 81 daily.  - Check lipids today.   Followup in 3 wks with pharmacist for BP medication titration.  If stress test is normal, I will see him in 3 months.   Loralie Champagne 12/23/2019

## 2019-12-24 ENCOUNTER — Telehealth (HOSPITAL_COMMUNITY): Payer: Self-pay

## 2019-12-24 DIAGNOSIS — E785 Hyperlipidemia, unspecified: Secondary | ICD-10-CM

## 2019-12-24 MED ORDER — ROSUVASTATIN CALCIUM 10 MG PO TABS
10.0000 mg | ORAL_TABLET | Freq: Every day | ORAL | 3 refills | Status: DC
Start: 2019-12-24 — End: 2020-08-30

## 2019-12-24 NOTE — Telephone Encounter (Signed)
-----   Message from Larey Dresser, MD sent at 12/22/2019  9:25 PM EDT ----- LDL is very high.  Would start Crestor 10 mg daily.  Lipids/LFTs in 2 months.

## 2019-12-24 NOTE — Telephone Encounter (Signed)
Patient advised and verbalized understanding. New Rx sent into pharmacy,will recheck lipids/lfts at next appt with DM   Meds ordered this encounter  Medications  . rosuvastatin (CRESTOR) 10 MG tablet    Sig: Take 1 tablet (10 mg total) by mouth daily.    Dispense:  90 tablet    Refill:  3    Orders Placed This Encounter  Procedures  . Lipid Profile    Standing Status:   Future    Standing Expiration Date:   12/23/2020    Order Specific Question:   Release to patient    Answer:   Immediate  . Hepatic function panel    Standing Status:   Future    Standing Expiration Date:   12/23/2020    Order Specific Question:   Release to patient    Answer:   Immediate

## 2019-12-26 LAB — METANEPHRINES, PLASMA
Metanephrine, Free: 59.4 pg/mL (ref 0.0–88.0)
Normetanephrine, Free: 147.5 pg/mL — ABNORMAL HIGH (ref 0.0–136.8)

## 2019-12-30 ENCOUNTER — Telehealth (HOSPITAL_COMMUNITY): Payer: Self-pay

## 2019-12-30 DIAGNOSIS — E785 Hyperlipidemia, unspecified: Secondary | ICD-10-CM

## 2019-12-30 NOTE — Telephone Encounter (Signed)
-----   Message from Larey Dresser, MD sent at 12/26/2019  2:35 PM EDT ----- Slightly abnormal.  Will get urine collection for pheochromocytoma when he comes back in.

## 2019-12-30 NOTE — Telephone Encounter (Signed)
Orders Placed This Encounter  Procedures  . Catecholamines, fractionated, urine, 24 hour    Standing Status:   Future    Standing Expiration Date:   12/29/2020    Order Specific Question:   Release to patient    Answer:   Immediate

## 2020-01-05 ENCOUNTER — Other Ambulatory Visit (HOSPITAL_COMMUNITY): Payer: Managed Care, Other (non HMO)

## 2020-01-12 ENCOUNTER — Other Ambulatory Visit: Payer: Self-pay

## 2020-01-12 ENCOUNTER — Ambulatory Visit (HOSPITAL_COMMUNITY)
Admission: RE | Admit: 2020-01-12 | Discharge: 2020-01-12 | Disposition: A | Discharge status: Home / Self Care | Payer: Managed Care, Other (non HMO) | Source: Ambulatory Visit | Attending: Internal Medicine | Admitting: Internal Medicine

## 2020-01-12 DIAGNOSIS — E785 Hyperlipidemia, unspecified: Secondary | ICD-10-CM | POA: Insufficient documentation

## 2020-01-12 LAB — HEPATIC FUNCTION PANEL
ALT: 29 U/L (ref 0–44)
AST: 23 U/L (ref 15–41)
Albumin: 4.3 g/dL (ref 3.5–5.0)
Alkaline Phosphatase: 68 U/L (ref 38–126)
Bilirubin, Direct: <0.1 mg/dL (ref 0.0–0.2)
Total Bilirubin: 0.6 mg/dL (ref 0.3–1.2)
Total Protein: 7.2 g/dL (ref 6.5–8.1)

## 2020-01-12 LAB — LIPID PANEL
Cholesterol: 174 mg/dL (ref 0–200)
HDL: 40 mg/dL — ABNORMAL LOW (ref >40)
LDL Cholesterol: 80 mg/dL (ref 0–99)
Total CHOL/HDL Ratio: 4.4 RATIO
Triglycerides: 270 mg/dL — ABNORMAL HIGH (ref <150)
VLDL: 54 mg/dL — ABNORMAL HIGH (ref 0–40)

## 2020-01-14 ENCOUNTER — Telehealth (HOSPITAL_COMMUNITY): Payer: Self-pay

## 2020-01-14 DIAGNOSIS — E785 Hyperlipidemia, unspecified: Secondary | ICD-10-CM

## 2020-01-14 NOTE — Telephone Encounter (Signed)
Patient advised and verbalized understanding. Lab orders reentered.   Orders Placed This Encounter  Procedures   Hepatic function panel    Standing Status:   Future    Standing Expiration Date:   01/13/2021    Order Specific Question:   Release to patient    Answer:   Immediate   Lipid Profile    Standing Status:   Future    Standing Expiration Date:   01/13/2021    Order Specific Question:   Release to patient    Answer:   Immediate

## 2020-01-14 NOTE — Telephone Encounter (Signed)
-----   Message from Larey Dresser, MD sent at 01/12/2020  4:20 PM EDT ----- Lipid check was supposed to be in 2 months, not 2 wks.  LDL is getting better, continue to work on diet, check lipids/LFTs in 2 months.

## 2020-01-20 ENCOUNTER — Ambulatory Visit (HOSPITAL_COMMUNITY)
Admission: RE | Admit: 2020-01-20 | Discharge: 2020-01-20 | Disposition: A | Discharge status: Home / Self Care | Payer: Managed Care, Other (non HMO) | Source: Ambulatory Visit | Attending: Internal Medicine | Admitting: Internal Medicine

## 2020-01-20 ENCOUNTER — Encounter (HOSPITAL_COMMUNITY): Payer: Self-pay

## 2020-01-20 ENCOUNTER — Other Ambulatory Visit: Payer: Self-pay

## 2020-01-20 VITALS — BP 137/91 | HR 88 | Wt 242.2 lb

## 2020-01-20 DIAGNOSIS — Z7982 Long term (current) use of aspirin: Secondary | ICD-10-CM | POA: Diagnosis not present

## 2020-01-20 DIAGNOSIS — R519 Headache, unspecified: Secondary | ICD-10-CM | POA: Diagnosis not present

## 2020-01-20 DIAGNOSIS — I341 Nonrheumatic mitral (valve) prolapse: Secondary | ICD-10-CM | POA: Diagnosis not present

## 2020-01-20 DIAGNOSIS — R5383 Other fatigue: Secondary | ICD-10-CM | POA: Diagnosis not present

## 2020-01-20 DIAGNOSIS — R079 Chest pain, unspecified: Secondary | ICD-10-CM | POA: Diagnosis not present

## 2020-01-20 DIAGNOSIS — I10 Essential (primary) hypertension: Secondary | ICD-10-CM

## 2020-01-20 DIAGNOSIS — Z8249 Family history of ischemic heart disease and other diseases of the circulatory system: Secondary | ICD-10-CM | POA: Insufficient documentation

## 2020-01-20 DIAGNOSIS — Z79899 Other long term (current) drug therapy: Secondary | ICD-10-CM | POA: Insufficient documentation

## 2020-01-20 LAB — BASIC METABOLIC PANEL
Anion gap: 11 (ref 5–15)
BUN: 13 mg/dL (ref 6–20)
CO2: 23 mmol/L (ref 22–32)
Calcium: 9.4 mg/dL (ref 8.9–10.3)
Chloride: 105 mmol/L (ref 98–111)
Creatinine, Ser: 0.96 mg/dL (ref 0.61–1.24)
GFR calc Af Amer: >60 mL/min (ref >60)
GFR calc non Af Amer: >60 mL/min (ref >60)
Glucose, Bld: 94 mg/dL (ref 70–99)
Potassium: 3.9 mmol/L (ref 3.5–5.1)
Sodium: 139 mmol/L (ref 135–145)

## 2020-01-20 MED ORDER — LISINOPRIL-HYDROCHLOROTHIAZIDE 20-25 MG PO TABS: 1.0000 | ORAL_TABLET | Freq: Every day | ORAL | 3 refills | Status: DC

## 2020-01-20 NOTE — Progress Notes (Signed)
PCP: Formerly Dr. Deborra Medina Cardiology: Dr. Aundra Dubin  HPI: 54 y.o. with history of HTN and mitral valve prolapse was referred by Dr. Coralie Keens for evaluation of chest pain.  Patient has no personal cardiac history.  His grandfather died at 70 with MI.  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  He drinks 4-5 cups of coffee/day.  Rare ETOH, no smoking.  He is not currently on any meds for BP control.  He uses Excedrin for daily headaches (mild) x years.  BP was very high at last clinic visit (12/22/19) at 160/110, usually 140s/100s at home when checked by his wife.   Recently presented to HF Clinic with Dr. Aundra Dubin on 12/22/19. A week prior to clinic visit, he developed a flushing sensation all over his body and then chest pressure/tightness centrally that lasted for about 20 minutes then spontaneously resolved.  He didn't get exertional chest pain.  He had not had a recurrence.  He was SOB with heavier activity like walking fast up stairs.  Positive for generalized fatigue.  Occasional short palpitations.  No syncope.    Today he returns for pharmacist BP medication titration. At last visit with MD, lisinopril/HCTZ 20-12.5 mg daily was started. Overall, he says he's feeling much better. Endorses only a few instances of dizziness when standing up after sitting on couch for awhile. Denies fatigue, chest pain or palpitations. Denies SOB/DOE. No appetite changes. He's tolerating all medications well.  HTN Medications: Lisinopril-HCTZ 20-12.5 mg daily  Has the patient been experiencing any side effects to the medications prescribed?  No  Does the patient have any problems obtaining medications due to transportation or finances?   No - [employer] commercial prescription insurance  Understanding of regimen: good Understanding of indications: good Potential of compliance: good Patient understands to avoid NSAIDs. Patient understands to avoid decongestants.     Pertinent Lab Values: 01/20/20 . Serum creatinine 0.96, BUN 13, Potassium 3.9, Sodium 139  Vital Signs: . Weight: 242.2 lbs (last clinic weight: 248 lbs) . Blood pressure: 137/91 mmHg . Heart rate: 88 bpm  Assessment: 1. HTN: BP in clinic today 137/91 - Noted home BP readings have ranged 120s-150/85-100. Goal <130/80. - Increase lisinopril/HCTZ to 20-25 mg daily - BP checks daily with increase in lisinopril/HCTZ  - Work on weight loss >> diet, exercise.  - Keep ETOH minimal.   2. Mitral valve prolapse: Patient was told in the past that he has this.  He did not have a click or murmur on exam, think mitral valve prolapse is unlikely per Dr. Aundra Dubin.   3. OSA: Suspected, daytime fatigue.  - Dr. Aundra Dubin arranged for home sleep study.   4. Chest pain: Patient had an episode of prolonged CP in mid-July 2021.  He has RFs for CAD, including age/gender and HTN.  - Dr. Aundra Dubin to arrange for coronary CTA to look for significant coronary disease.  - LDL 80   01/12/20 (down from 164 on 12/22/19) - Continue ASA 81 daily.  - Continue rosuvastatin 10 mg daily.  - Lipids/LFTs in 1 month   Plan: 1) Medication changes: Based on clinical presentation, vital signs and recent labs will increase lisinopril/HCTZ to 20-25 mg daily. 2) Follow-up 03/23/20 with Dr. Rush Farmer, PharmD, BCPS, [facility], [place] [facility] Pharmacist [phone]

## 2020-01-20 NOTE — Patient Instructions (Addendum)
It was a pleasure seeing you today!  MEDICATIONS: -We are changing your medications today -Increase lisinopril/hydrochlorothiazide to 20/25 mg (1 tablet) daily -Call if you have questions about your medications.  LABS: -We will call you if your labs need attention.  NEXT APPOINTMENT: Return to clinic in 2 months with Dr. Aundra Dubin.  In general, to take care of your heart failure: -Limit your fluid intake to 2 Liters (half-gallon) per day.   -Limit your salt intake to ideally 2-3 grams (2000-3000 mg) per day. -Weigh yourself daily and record, and bring that "weight diary" to your next appointment.  (Weight gain of 2-3 pounds in 1 day typically means fluid weight.) -The medications for your heart are to help your heart and help you live longer.   -Please contact us before stopping any of your heart medications.  Call the clinic at [phone] with questions or to reschedule future appointments.

## 2020-03-23 ENCOUNTER — Encounter (HOSPITAL_COMMUNITY): Payer: Managed Care, Other (non HMO) | Admitting: Cardiology

## 2020-04-02 ENCOUNTER — Other Ambulatory Visit: Payer: Self-pay

## 2020-04-02 ENCOUNTER — Ambulatory Visit (HOSPITAL_COMMUNITY)
Admission: RE | Admit: 2020-04-02 | Discharge: 2020-04-02 | Disposition: A | Discharge status: Home / Self Care | Payer: Managed Care, Other (non HMO) | Source: Ambulatory Visit | Attending: Cardiology | Admitting: Cardiology

## 2020-04-02 ENCOUNTER — Encounter (HOSPITAL_COMMUNITY): Payer: Self-pay | Admitting: Cardiology

## 2020-04-02 VITALS — BP 124/80 | HR 79 | Wt 244.2 lb

## 2020-04-02 DIAGNOSIS — I10 Essential (primary) hypertension: Secondary | ICD-10-CM | POA: Insufficient documentation

## 2020-04-02 DIAGNOSIS — R079 Chest pain, unspecified: Secondary | ICD-10-CM | POA: Insufficient documentation

## 2020-04-02 DIAGNOSIS — E785 Hyperlipidemia, unspecified: Secondary | ICD-10-CM | POA: Diagnosis not present

## 2020-04-02 DIAGNOSIS — Z7982 Long term (current) use of aspirin: Secondary | ICD-10-CM | POA: Diagnosis not present

## 2020-04-02 DIAGNOSIS — Z8249 Family history of ischemic heart disease and other diseases of the circulatory system: Secondary | ICD-10-CM | POA: Insufficient documentation

## 2020-04-02 DIAGNOSIS — Z79899 Other long term (current) drug therapy: Secondary | ICD-10-CM | POA: Insufficient documentation

## 2020-04-02 HISTORY — DX: Heart failure, unspecified: I50.9

## 2020-04-02 NOTE — Patient Instructions (Addendum)
START Coenzyme Q10 200mg  Daily  Please call our office in May 2022 to schedule your follow up appointment  Your physician has requested that you have cardiac CT. Cardiac computed tomography (CT) is a painless test that uses an x-ray machine to take clear, detailed pictures of your heart. For further information please visit [email or web address]  If you have any questions or concerns before your next appointment please send [place] a message through [place] or call our office at [phone].    TO LEAVE A MESSAGE FOR THE NURSE SELECT OPTION 2, PLEASE LEAVE A MESSAGE INCLUDING: . YOUR NAME . DATE OF BIRTH . CALL BACK NUMBER . REASON FOR CALL**this is important as we prioritize the call backs  YOU WILL RECEIVE A CALL BACK THE SAME DAY AS LONG AS YOU CALL BEFORE 4:00 PM

## 2020-04-02 NOTE — Progress Notes (Signed)
PCP: Formerly Dr. Deborra Medina Cardiology: Dr. Aundra Dubin  54 y.o. with history of HTN and chest pain returns for cardiology followup.  His grandfather died at 49 with MI.  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  He drinks 4-5 cups of coffee/day.  Rare ETOH, no smoking.     After last appointment, he was started on lisinopril/HCTZ for elevated BP.  BP now appears well-controlled.  No more headaches.  LDL was elevated, and he was started on Crestor.  Followup lipid panel was improved.  He reports developing relatively mild crampy pain in the hips/upper thighs since starting Crestor.  He tried stopping the Crestor for a couple of days and did not have the crampy symptoms.  Prior to last appointment, he had a severe chest pain episode lasting 20-30 minutes.  We set him up for a coronary CTA but he never got it done.  He says that he has not had anymore chest pain but his wife thinks he has. No exertional dyspnea.  No snoring/daytime sleepiness.  No palpitations.   Labs (7/21): LDL 164 Labs (8/21): K 3.9, creatinine 0.96, LDL 80  PMH: 1. HTN 2. GERD 3. H/o mitral valve prolapse diagnosed remotely.  4. Chronic headaches 5. Hyperlipidemia  SH: Married, Financial planner, nonsmoker, rare ETOH.   FH: Father with COPD, colon cancer.  Grandfather with MI at 68.   ROS: All systems reviewed and negative except as per HPI.   Current Outpatient Medications  Medication Sig Dispense Refill  . acetaminophen (TYLENOL) 500 MG tablet Take 500 mg by mouth every 6 (six) hours as needed.    . Aspirin-Acetaminophen-Caffeine (EXCEDRIN EXTRA STRENGTH PO) Take 1 tablet by mouth daily. Foe headaches    . ibuprofen (MOTRIN IB) 200 MG tablet Motrin IB 200 mg tablet    . lisinopril-hydrochlorothiazide (ZESTORETIC) 20-25 MG tablet Take 1 tablet by mouth daily. 90 tablet 3  . ranitidine (ZANTAC) 150 MG capsule Take 150 mg by mouth daily.    . rosuvastatin (CRESTOR) 10 MG tablet Take  1 tablet (10 mg total) by mouth daily. 90 tablet 3   Current Facility-Administered Medications  Medication Dose Route Frequency Provider Last Rate Last Admin  . 0.9 %  sodium chloride infusion  500 mL Intravenous Once Pyrtle, Lajuan Lines, MD       BP 124/80   Pulse 79   Wt 110.8 kg (244 lb 3.2 oz)   SpO2 100%   BMI 35.04 kg/m  General: NAD Neck: No JVD, no thyromegaly or thyroid nodule.  Lungs: Clear to auscultation bilaterally with normal respiratory effort. CV: Nondisplaced PMI.  Heart regular S1/S2, no S3/S4, no murmur.  No peripheral edema.  No carotid bruit.  Normal pedal pulses.  Abdomen: Soft, nontender, no hepatosplenomegaly, no distention.  Skin: Intact without lesions or rashes.  Neurologic: Alert and oriented x 3.  Psych: Normal affect. Extremities: No clubbing or cyanosis.  HEENT: Normal.   Assessment/Plan: 1. HTN: Much better control.  - Continue lisinopril/HCTZ 20/12.5 daily.   - Work on weight loss => diet, exercise.  - Keep ETOH minimal.  2. Mitral valve prolapse: Patient was told in the past that he has this.  He does not have a click or murmur on exam, think mitral valve prolapse is unlikely.  3. Chest pain: Patient had a severe chest episode now a couple of months ago.  He has RFs for CAD, including age/gender, hyperlipidemia and HTN. His wife thinks  he has had more chest pain but he denies.  - I will re-order a coronary CTA to look for significant coronary disease.  - If he has significant coronary disease, will need to start ASA 81 daily.   4. Hyperlipidemia: Significant improvement in LDL on Crestor but having some myalgias. - I asked him to try coenzyme Q10 200 mg daily.  - If he has significant coronary disease on CTA, will aim for LDL < 70.   Followup in 6 months if coronary CTA low risk.   Loralie Champagne 04/02/2020

## 2020-04-26 NOTE — Progress Notes (Signed)
Phone: [phone]   Subjective:  Patient presents today for their annual physical. Chief complaint-noted.   See problem oriented charting- ROS- full  review of systems was completed and negative  except for: some DOE- work up ongoing with cardiology, minimally productive cough for 2.5 months, headaches improved after BP control, numbness, tingling in arms from carpal tunnel and ulnar nerve issues, GERD, tinnitus- not pulsatile, allergies  The following were reviewed and entered/updated in epic: Past Medical History:  Diagnosis Date  . Arthritis    hand  . Basal cell carcinoma    back and buttocks  . Blood pressure elevated without history of HTN    tx w/hctz  . Cancer ([place])   . CHF (congestive heart failure) ([place])   . GERD (gastroesophageal reflux disease)   . Headache    otc med PRN  . History of chicken pox    Had it twice  . MVP (mitral valve prolapse)    not heard by cardiology- only heard with military physical in 1993  . Seasonal allergies    Patient Active Problem List   Diagnosis Date Noted  . Hyperlipidemia 04/27/2020    Priority: Medium  . Stiffness of joints of both hands 02/19/2019    Priority: Medium  . GERD (gastroesophageal reflux disease) 11/05/2017    Priority: Medium  . Essential hypertension 11/05/2017    Priority: Medium  . Carpal tunnel syndrome of right wrist 02/19/2019    Priority: Low  . 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 Entrapment of right ulnar nerve 02/19/2019    Priority: Low  . Edema 11/05/2017    Priority: Low   Past Surgical History:  Procedure Laterality Date  . BASAL CELL CARCINOMA EXCISION N/A 2017   From back, lower buttocks  . COLONOSCOPY    . EYE SURGERY Bilateral    Lasik  . WISDOM TOOTH EXTRACTION      Family History  Problem Relation Age of Onset  . Hypertension Mother        2 in 2021  . Hyperlipidemia Mother   . Anxiety disorder Mother   . COPD Father        o2 dependent. 82 in  2021. former smoker  . Skin cancer Father   . Healthy Brother        25 in 2021  . Dementia Maternal Grandmother   . Hearing loss Maternal Grandfather   . Heart disease Maternal Grandfather        sudden heart attack 48  . Cancer Paternal Grandmother        Abdominal Cancer  . Heart disease Paternal Grandfather        aortic aneurysm- smoker  . Healthy Daughter   . Healthy Son   . Cancer Paternal Aunt        Abdominal  . Colon cancer Paternal Uncle   . Breast cancer Other        on multiple male family members  . Colon polyps Neg Hx   . Rectal cancer Neg Hx   . Stomach cancer Neg Hx     Medications- reviewed and updated Current Outpatient Medications  Medication Sig Dispense Refill  . acetaminophen (TYLENOL) 500 MG tablet Take 500 mg by mouth every 6 (six) hours as needed.    . Aspirin-Acetaminophen-Caffeine (EXCEDRIN EXTRA STRENGTH PO) Take 1 tablet by mouth daily. Foe headaches    . co-enzyme Q-10 30 MG capsule Take 100 mg by mouth daily.    Marland Kitchen  famotidine (PEPCID) 10 MG tablet Take 10 mg by mouth daily.    Marland Kitchen lisinopril-hydrochlorothiazide (ZESTORETIC) 20-25 MG tablet Take 1 tablet by mouth daily. 90 tablet 3  . rosuvastatin (CRESTOR) 10 MG tablet Take 1 tablet (10 mg total) by mouth daily. 90 tablet 3   No current facility-administered medications for this visit.    Allergies-reviewed and updated No Known Allergies  Social History   Social History Narrative   Married 2017. 1 step son. 2 children in their 53s.       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 Oropharynx normal Neck: no thyromegaly CV: RRR no murmurs rubs or gallops Lungs: CTAB no crackles, wheeze, rhonchi Abdomen:  soft/nontender/nondistended/normal bowel sounds. No rebound or guarding.  Ext: no edema Skin: warm, dry Neuro: grossly normal, moves all extremities, PERRLA    Assessment and Plan  54 y.o. male presenting for annual physical.  Health Maintenance counseling: 1. Anticipatory guidance: Patient counseled regarding regular dental exams -q6 months, eye exams - not in years- recommended starting,  avoiding smoking and second hand smoke , limiting alcohol to 2 beverages per day - 1 per month.   2. Risk factor reduction:  Advised patient of need for regular exercise and diet rich and fruits and vegetables to reduce risk of heart attack and stroke. Exercise- not currently- recommended 150 minutes a week after cardiac evaluation. Diet-feels could cut down on soda and increase water.  Wt Readings from Last 3 Encounters:  04/27/20 245 lb 3.2 oz (111.2 kg)  04/02/20 244 lb 3.2 oz (110.8 kg)  01/20/20 242 lb 3.2 oz (109.9 kg)  3. Immunizations/screenings/ancillary studies- flu shot today. shingrix star today Immunization History  Administered Date(s) Administered  . Influenza Inj Mdck Quad Pf 03/02/2019  . Influenza,inj,Quad PF,6+ Mos 04/27/2020  . Influenza-Unspecified 03/10/2019  . PFIZER SARS-COV-2 Vaccination 08/14/2019, 09/08/2019, 03/09/2020  . Tdap 11/05/2017  . Zoster Recombinat (Shingrix) 04/27/2020  4. Prostate cancer screening- maternal uncle with prostate cancer- wants to go ahead and continue yearly screening   Lab Results  Component Value Date   PSA 1.59 11/05/2017   5. Colon cancer screening - 2019 with family history and polyps with 2022 repeat 6. Skin cancer screening- Dr. [place] Crane. advised regular sunscreen use. Denies worrisome, changing, or new skin lesions.  7. Never smoker 8. STD screening - monogamous so not required and prior to that celibate  Status of chronic or acute concerns   # chronic cough S:zyrtec daily with no improvement (tends to get cough in the fall) . 2.5 months  of issues without improvement.  No history of cough. No asthma. Some allergies. Some GERD.  A/P: 54 year old male with 2-1/2 months of chronic cough and also some mild shortness of breath with activity but could be deconditioning -evaluate for walking pneumonia or lung nodule/mass with chest x-ray. -Considered to be allergies-consider adding Flonase -Could be GERD-consider switching PPI  #hypertension S: medication: Lisinopril hydrochlorothiazide 20-25 mg -Reports prior headaches improved once blood pressure was controlled BP Readings from Last 3 Encounters:  04/27/20 124/84  04/02/20 124/80  01/20/20 (!) 137/91  A/P: Stable. Continue current medications. -  Has been using ibuprofen intermittently perhaps once a month for hand pain-recommended trial of Voltaren/diclofenac gel instead due to hypertension  #hyperlipidemia-peak LDL over 160 S: Medication: Crestor 10 mg along with coenzyme Q 10 Lab Results  Component Value Date   CHOL 174 01/12/2020   HDL 40 (L) 01/12/2020   LDLCALC 80 01/12/2020   TRIG 270 (H) 01/12/2020   CHOLHDL 4.4 01/12/2020   A/P: Patient with excellent response to Crestor with over 50% reduction in LDL-continue current medication  #Prior chest pain-patient has upcoming coronary CT for further information-he is followed by Dr. Aundra Dubin of cardiology-plan would be to be more aggressive about LDL and start aspirin if significant coronary findings.  He has not had recurrent chest pain thankfully    Recommended follow up: Return in about 1 year (around 04/27/2021) for physical or sooner if needed. Future Appointments  Date Time Provider [place]  04/29/2020  7:45 AM [facility] 1 [facility]  06/29/2020  9:00 AM [facility] NURSE [facility]    Lab/Order associations:  fasting   ICD-10-CM   1. Chronic cough  R05.3 DG Chest 2 View    CANCELED: DG Chest 2 View  2. Stiffness of joints of both hands  M25.641    M25.642   3. Edema, unspecified type  R60.9   4.  Gastroesophageal reflux disease without esophagitis  K21.9   5. Hyperlipidemia, unspecified hyperlipidemia type  E78.5   6. Screening for prostate cancer  Z12.5 PSA    PSA  7. Need for immunization against influenza  Z23 Flu Vaccine QUAD 36+ mos IM  8. Immunization due  Z23 Varicella-zoster vaccine IM (Shingrix)   Return precautions advised.  Garret Reddish, MD

## 2020-04-27 ENCOUNTER — Ambulatory Visit (INDEPENDENT_AMBULATORY_CARE_PROVIDER_SITE_OTHER): Payer: Managed Care, Other (non HMO) | Admitting: Family Medicine

## 2020-04-27 ENCOUNTER — Other Ambulatory Visit: Payer: Self-pay

## 2020-04-27 ENCOUNTER — Telehealth (HOSPITAL_COMMUNITY): Payer: Self-pay | Admitting: *Deleted

## 2020-04-27 ENCOUNTER — Encounter: Payer: Self-pay | Admitting: Family Medicine

## 2020-04-27 VITALS — BP 124/84 | HR 96 | Temp 98.4°F | Ht 70.0 in | Wt 245.2 lb

## 2020-04-27 DIAGNOSIS — R053 Chronic cough: Secondary | ICD-10-CM | POA: Diagnosis not present

## 2020-04-27 DIAGNOSIS — E785 Hyperlipidemia, unspecified: Secondary | ICD-10-CM | POA: Insufficient documentation

## 2020-04-27 DIAGNOSIS — M25641 Stiffness of right hand, not elsewhere classified: Secondary | ICD-10-CM | POA: Diagnosis not present

## 2020-04-27 DIAGNOSIS — K219 Gastro-esophageal reflux disease without esophagitis: Secondary | ICD-10-CM | POA: Diagnosis not present

## 2020-04-27 DIAGNOSIS — Z125 Encounter for screening for malignant neoplasm of prostate: Secondary | ICD-10-CM

## 2020-04-27 DIAGNOSIS — M25642 Stiffness of left hand, not elsewhere classified: Secondary | ICD-10-CM

## 2020-04-27 DIAGNOSIS — R609 Edema, unspecified: Secondary | ICD-10-CM

## 2020-04-27 DIAGNOSIS — Z23 Encounter for immunization: Secondary | ICD-10-CM

## 2020-04-27 LAB — PSA: PSA: 1.23 ng/mL (ref <=4.0)

## 2020-04-27 NOTE — Patient Instructions (Addendum)
Health Maintenance Due  Topic Date Due  . INFLUENZA VACCINE - today with labs  Shingrix #1 today. Repeat injection in 2-6 months. Schedule a nurse visit for the 2nd injection before you leave today (at the check out desk) 12/28/2019   Try voltaren/diclofenac gel for hands up to 4x a day if bothering you  Reasonable to get a baseline eye exam past age 54. Would recommend yearly with mom's history glaucoma    Please go to [facility]  central X-ray (updated 07/24/2019) - located [address] across the street from [place] - in the basement - Hours: 8:30-5:00 PM M-F (with lunch from 12:30- 1 PM). You do NOT need an appointment.  - Please ensure you are covid symptom free before going in(No fever, chills, cough, congestion, runny nose, shortness of breath, fatigue, body aches, sore throat, headache, nausea, vomiting, diarrhea, or new loss of taste or smell. No known contacts with covid 19 or someone being tested for covid 19)  Please stop by lab before you go If you have mychart- we will send your results within 3 business days of [place] receiving them.  If you do not have mychart- we will call you about results within 5 business days of [place] receiving them.  *please note we are currently using [facility] which has a longer processing time than [facility] typically so labs may not come back as quickly as in the past *please also note that you will see labs on mychart as soon as they post. I will later go in and write notes on them- will say "notes from Dr. Yong Channel"  Recommended follow up: Return in about 1 year (around 04/27/2021) for physical or sooner if needed.

## 2020-04-27 NOTE — Telephone Encounter (Signed)

## 2020-04-29 ENCOUNTER — Ambulatory Visit (HOSPITAL_COMMUNITY)
Admission: RE | Admit: 2020-04-29 | Discharge: 2020-04-29 | Disposition: A | Discharge status: Home / Self Care | Payer: Managed Care, Other (non HMO) | Source: Ambulatory Visit | Attending: Cardiology | Admitting: Cardiology

## 2020-04-29 ENCOUNTER — Other Ambulatory Visit: Payer: Self-pay

## 2020-04-29 ENCOUNTER — Encounter: Payer: Managed Care, Other (non HMO) | Admitting: *Deleted

## 2020-04-29 DIAGNOSIS — R079 Chest pain, unspecified: Secondary | ICD-10-CM | POA: Insufficient documentation

## 2020-04-29 DIAGNOSIS — Z006 Encounter for examination for normal comparison and control in clinical research program: Secondary | ICD-10-CM

## 2020-04-29 MED ORDER — NITROGLYCERIN 0.4 MG SL SUBL
SUBLINGUAL_TABLET | SUBLINGUAL | Status: AC
  Filled 2020-04-29: qty 1

## 2020-04-29 MED ORDER — NITROGLYCERIN 0.4 MG SL SUBL
0.4000 mg | SUBLINGUAL_TABLET | Freq: Once | SUBLINGUAL | Status: AC
  Administered 2020-04-29: 0.4 mg via SUBLINGUAL

## 2020-04-29 MED ORDER — IOHEXOL 350 MG/ML SOLN
80.0000 mL | Freq: Once | INTRAVENOUS | Status: AC | PRN
  Administered 2020-04-29: 80 mL via INTRAVENOUS

## 2020-04-29 NOTE — Research (Signed)
CADFEM Informed Consent        Subject Name:   Richard French   Subject met inclusion and exclusion criteria.  The informed consent form, study requirements and expectations were reviewed with the subject and questions and concerns were addressed prior to the signing of the consent form.  The subject verbalized understanding of the trial requirements.  The subject agreed to participate in the IDENTIFY trial and signed the informed consent at 0705 am on 04-29-2020.  The informed consent was obtained prior to performance of any protocol-specific procedures for the subject.  A copy of the signed informed consent was given to the subject and a copy was placed in the subject's medical record.   Leota Jacobsen, BSN, RN [facility] [phone]

## 2020-05-11 ENCOUNTER — Telehealth (HOSPITAL_COMMUNITY): Payer: Self-pay | Admitting: *Deleted

## 2020-05-11 DIAGNOSIS — I10 Essential (primary) hypertension: Secondary | ICD-10-CM

## 2020-05-11 MED ORDER — HYDROCHLOROTHIAZIDE 25 MG PO TABS: 25.0000 mg | ORAL_TABLET | Freq: Every day | ORAL | 3 refills | Status: DC

## 2020-05-11 MED ORDER — LOSARTAN POTASSIUM 50 MG PO TABS
50.0000 mg | ORAL_TABLET | Freq: Every day | ORAL | 3 refills | Status: DC
Start: 2020-05-11 — End: 2020-08-30

## 2020-05-11 NOTE — Telephone Encounter (Signed)
Pt wife called stating pt has a pretty significant cough since starting lisinopril-hctz. Pts PCP wants Dr.McLean to change bp medication.   Routed to [place]

## 2020-05-11 NOTE — Telephone Encounter (Signed)
Pts wife aware and verbalized understanding. Lab appt scheduled. New script sent to [facility].

## 2020-05-11 NOTE — Telephone Encounter (Signed)
Stop lisinopril-HCTZ, start losartan 50 mg daily + HCTZ 25 mg daily. BMET 10 days.

## 2020-05-25 ENCOUNTER — Ambulatory Visit (HOSPITAL_COMMUNITY)
Admission: RE | Admit: 2020-05-25 | Discharge: 2020-05-25 | Disposition: A | Discharge status: Home / Self Care | Payer: Managed Care, Other (non HMO) | Source: Ambulatory Visit | Attending: Cardiology | Admitting: Cardiology

## 2020-05-25 ENCOUNTER — Other Ambulatory Visit: Payer: Self-pay

## 2020-05-25 DIAGNOSIS — I10 Essential (primary) hypertension: Secondary | ICD-10-CM | POA: Diagnosis not present

## 2020-05-25 DIAGNOSIS — R030 Elevated blood-pressure reading, without diagnosis of hypertension: Secondary | ICD-10-CM

## 2020-05-25 DIAGNOSIS — E785 Hyperlipidemia, unspecified: Secondary | ICD-10-CM

## 2020-05-25 LAB — BASIC METABOLIC PANEL
Anion gap: 10 (ref 5–15)
BUN: 14 mg/dL (ref 6–20)
CO2: 28 mmol/L (ref 22–32)
Calcium: 9.4 mg/dL (ref 8.9–10.3)
Chloride: 104 mmol/L (ref 98–111)
Creatinine, Ser: 1.03 mg/dL (ref 0.61–1.24)
GFR, Estimated: >60 mL/min (ref >60)
Glucose, Bld: 101 mg/dL — ABNORMAL HIGH (ref 70–99)
Potassium: 3.5 mmol/L (ref 3.5–5.1)
Sodium: 142 mmol/L (ref 135–145)

## 2020-05-25 LAB — LIPID PANEL
Cholesterol: 147 mg/dL (ref 0–200)
HDL: 47 mg/dL (ref >40)
LDL Cholesterol: 82 mg/dL (ref 0–99)
Total CHOL/HDL Ratio: 3.1 RATIO
Triglycerides: 89 mg/dL (ref <150)
VLDL: 18 mg/dL (ref 0–40)

## 2020-05-25 LAB — HEPATIC FUNCTION PANEL
ALT: 33 U/L (ref 0–44)
AST: 24 U/L (ref 15–41)
Albumin: 4 g/dL (ref 3.5–5.0)
Alkaline Phosphatase: 72 U/L (ref 38–126)
Bilirubin, Direct: 0.1 mg/dL (ref 0.0–0.2)
Indirect Bilirubin: 0.3 mg/dL (ref 0.3–0.9)
Total Bilirubin: 0.4 mg/dL (ref 0.3–1.2)
Total Protein: 7.1 g/dL (ref 6.5–8.1)

## 2020-06-29 ENCOUNTER — Ambulatory Visit: Payer: Managed Care, Other (non HMO)

## 2020-08-30 ENCOUNTER — Other Ambulatory Visit (HOSPITAL_COMMUNITY): Payer: Self-pay

## 2020-08-30 MED ORDER — HYDROCHLOROTHIAZIDE 25 MG PO TABS: 25.0000 mg | ORAL_TABLET | Freq: Every day | ORAL | 3 refills | Status: DC

## 2020-08-30 MED ORDER — LOSARTAN POTASSIUM 50 MG PO TABS: 50.0000 mg | ORAL_TABLET | Freq: Every day | ORAL | 3 refills | Status: DC

## 2020-08-30 MED ORDER — ROSUVASTATIN CALCIUM 10 MG PO TABS: 10.0000 mg | ORAL_TABLET | Freq: Every day | ORAL | 3 refills | Status: DC

## 2020-09-14 ENCOUNTER — Telehealth: Payer: Self-pay | Admitting: *Deleted

## 2020-09-14 DIAGNOSIS — Z006 Encounter for examination for normal comparison and control in clinical research program: Secondary | ICD-10-CM

## 2020-09-14 NOTE — Telephone Encounter (Signed)
I called patient for 90-day Identify study. Patient stated doing well and no more chest pain.I reminded patient I would call in December for 1-year follow-up.

## 2020-12-01 ENCOUNTER — Other Ambulatory Visit (HOSPITAL_COMMUNITY): Payer: Self-pay | Admitting: Cardiology

## 2020-12-27 ENCOUNTER — Encounter (HOSPITAL_COMMUNITY): Payer: Self-pay | Admitting: Cardiology

## 2020-12-27 ENCOUNTER — Ambulatory Visit (HOSPITAL_COMMUNITY)
Admission: RE | Admit: 2020-12-27 | Discharge: 2020-12-27 | Disposition: A | Discharge status: Home / Self Care | Payer: Managed Care, Other (non HMO) | Source: Ambulatory Visit | Attending: Cardiology | Admitting: Cardiology

## 2020-12-27 ENCOUNTER — Other Ambulatory Visit: Payer: Self-pay

## 2020-12-27 VITALS — Wt 255.4 lb

## 2020-12-27 DIAGNOSIS — E785 Hyperlipidemia, unspecified: Secondary | ICD-10-CM | POA: Insufficient documentation

## 2020-12-27 DIAGNOSIS — I10 Essential (primary) hypertension: Secondary | ICD-10-CM

## 2020-12-27 DIAGNOSIS — I341 Nonrheumatic mitral (valve) prolapse: Secondary | ICD-10-CM | POA: Diagnosis not present

## 2020-12-27 DIAGNOSIS — Z79899 Other long term (current) drug therapy: Secondary | ICD-10-CM | POA: Insufficient documentation

## 2020-12-27 DIAGNOSIS — Z7982 Long term (current) use of aspirin: Secondary | ICD-10-CM | POA: Diagnosis not present

## 2020-12-27 LAB — LIPID PANEL
Cholesterol: 133 mg/dL (ref 0–200)
HDL: 38 mg/dL — ABNORMAL LOW (ref >40)
LDL Cholesterol: 66 mg/dL (ref 0–99)
Total CHOL/HDL Ratio: 3.5 RATIO
Triglycerides: 145 mg/dL (ref <150)
VLDL: 29 mg/dL (ref 0–40)

## 2020-12-27 LAB — BASIC METABOLIC PANEL
Anion gap: 9 (ref 5–15)
BUN: 16 mg/dL (ref 6–20)
CO2: 28 mmol/L (ref 22–32)
Calcium: 9.6 mg/dL (ref 8.9–10.3)
Chloride: 101 mmol/L (ref 98–111)
Creatinine, Ser: 1.08 mg/dL (ref 0.61–1.24)
GFR, Estimated: >60 mL/min (ref >60)
Glucose, Bld: 85 mg/dL (ref 70–99)
Potassium: 3.8 mmol/L (ref 3.5–5.1)
Sodium: 138 mmol/L (ref 135–145)

## 2020-12-27 MED ORDER — HYDROCHLOROTHIAZIDE 25 MG PO TABS: 25.0000 mg | ORAL_TABLET | Freq: Every day | ORAL | 3 refills | Status: DC

## 2020-12-27 MED ORDER — LOSARTAN POTASSIUM 50 MG PO TABS: 50.0000 mg | ORAL_TABLET | Freq: Every day | ORAL | 3 refills | Status: DC

## 2020-12-27 MED ORDER — ROSUVASTATIN CALCIUM 10 MG PO TABS: 10.0000 mg | ORAL_TABLET | Freq: Every day | ORAL | 3 refills | Status: DC

## 2020-12-27 NOTE — Progress Notes (Signed)
PCP: Formerly Dr. Deborra Medina Cardiology: Dr. Aundra Dubin  54 y.o. with history of HTN and chest pain returns for cardiology followup.  His grandfather died at 48 with MI.  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  He drinks 4-5 cups of coffee/day.  Rare ETOH, no smoking.   Coronary CTA in 12/21 showed calcium score 0, no significant CAD.   He is doing well today.  Weight is up today, he is not exercising much.  BP borderline elevated.  He is on losartan now due to ACEI cough.  He denies exertional dyspnea or chest pain.  No lightheadedness or palpitations.   Labs (7/21): LDL 164 Labs (8/21): K 3.9, creatinine 0.96, LDL 80 Labs (12/21): LDL 82, HDL 47  PMH: 1. HTN 2. GERD 3. H/o mitral valve prolapse diagnosed remotely.  4. Chronic headaches 5. Hyperlipidemia 6. Coronary CTA (12/21): CAC 0 Agatston units,  no significant CAD, LAD bridging.   SH: Married, Financial planner, nonsmoker, rare ETOH.   FH: Father with COPD, colon cancer.  Grandfather with MI at 67.   ROS: All systems reviewed and negative except as per HPI.   Current Outpatient Medications  Medication Sig Dispense Refill   acetaminophen (TYLENOL) 500 MG tablet Take 500 mg by mouth every 6 (six) hours as needed.     Aspirin-Acetaminophen-Caffeine (EXCEDRIN EXTRA STRENGTH PO) Take 1 tablet by mouth daily. Foe headaches     Cetirizine HCl (ZYRTEC PO) Take by mouth.     co-enzyme Q-10 30 MG capsule Take 100 mg by mouth daily.     hydrochlorothiazide (HYDRODIURIL) 25 MG tablet Take 1 tablet (25 mg total) by mouth daily. 90 tablet 3   losartan (COZAAR) 50 MG tablet Take 1 tablet (50 mg total) by mouth daily. 90 tablet 3   rosuvastatin (CRESTOR) 10 MG tablet Take 1 tablet (10 mg total) by mouth daily. 90 tablet 3   No current facility-administered medications for this encounter.   Wt 115.8 kg (255 lb 6.4 oz)   BMI 36.65 kg/m  General: NAD Neck: No JVD, no thyromegaly or thyroid nodule.   Lungs: Clear to auscultation bilaterally with normal respiratory effort. CV: Nondisplaced PMI.  Heart regular S1/S2, no S3/S4, no murmur.  No peripheral edema.  No carotid bruit.  Normal pedal pulses.  Abdomen: Soft, nontender, no hepatosplenomegaly, no distention.  Skin: Intact without lesions or rashes.  Neurologic: Alert and oriented x 3.  Psych: Normal affect. Extremities: No clubbing or cyanosis.  HEENT: Normal.   Assessment/Plan: 1. HTN: Borderline elevated BP.  Has ACEI cough.  - Continue HCTZ 25 and losartan 50.  BMET today.   - Work on weight loss => diet, exercise.  - Keep ETOH minimal.  - Check BP daily x 2 wks and send me readings.  I will not change meds today.  2. Mitral valve prolapse: Patient was told in the past that he has this.  He does not have a click or murmur on exam, think mitral valve prolapse is unlikely.  3. Hyperlipidemia: Significant improvement in LDL on Crestor. - Check lipids.    Followup in 1 year  Loralie Champagne 12/27/2020

## 2020-12-27 NOTE — Patient Instructions (Signed)
EKG done today.  Labs done today. We will contact you only if your labs are abnormal.  No medication changes were made. Please continue all current medications as prescribed.  Your physician has requested that you regularly monitor and record your blood pressure readings at home daily for 2 weeks. Please use the same machine at the same time of day to check your readings and record them and send them to Dr. Aundra Dubin via [place] recommends that you schedule a follow-up appointment in: 1 year. Please contact our office in July 2023 for a august appointment.   If you have any questions or concerns before your next appointment please send [place] a message through [place] or call our office at [phone].    TO LEAVE A MESSAGE FOR THE NURSE SELECT OPTION 2, PLEASE LEAVE A MESSAGE INCLUDING: YOUR NAME DATE OF BIRTH CALL BACK NUMBER REASON FOR CALL**this is important as we prioritize the call backs  YOU WILL RECEIVE A CALL BACK THE SAME DAY AS LONG AS YOU CALL BEFORE 4:00 PM   Do the following things EVERYDAY: Weigh yourself in the morning before breakfast. Write it down and keep it in a log. Take your medicines as prescribed Eat low salt foods--Limit salt (sodium) to 2000 mg per day.  Stay as active as you can everyday Limit all fluids for the day to less than 2 liters   At the [facility], you and your health needs are our priority. As part of our continuing mission to provide you with exceptional heart care, we have created designated Provider Care Teams. These Care Teams include your primary Cardiologist (physician) and Advanced Practice Providers (APPs- Physician Assistants and Nurse Practitioners) who all work together to provide you with the care you need, when you need it.   You may see any of the following providers on your designated Care Team at your next follow up: Dr Glori Bickers Dr Haynes Kerns, NP Lyda Jester, [place] Audry Riles, PharmD   Please be sure to bring in all your medications bottles to every appointment.

## 2021-04-26 ENCOUNTER — Encounter: Payer: Self-pay | Admitting: Internal Medicine

## 2022-01-19 ENCOUNTER — Other Ambulatory Visit (HOSPITAL_COMMUNITY): Payer: Self-pay | Admitting: Cardiology

## 2022-02-20 ENCOUNTER — Encounter: Payer: Self-pay | Admitting: *Deleted

## 2022-03-03 IMAGING — CT CT HEART MORP W/ CTA COR W/ SCORE W/ CA W/CM &/OR W/O CM
4 of 7 series · 8 of 20 positions shown, 9 images · IV contrast (APPLIED)
Comparison: None.
COMPARISON: None.

Addendum:
EXAM:
OVER-READ INTERPRETATION  CT CHEST

The following report is an over-read performed by radiologist Dr.
Sandip Tiger [REDACTED] on 04/29/2020. This
over-read does not include interpretation of cardiac or coronary
anatomy or pathology. The coronary calcium score/coronary CTA
interpretation by the cardiologist is attached.
CLINICAL DATA: Chest pain
Cardiac CTA
MEDICATIONS:
Sub lingual nitro. 4mg x 2
TECHNIQUE: The patient was scanned on a Siemens [REDACTED]ice scanner. Gantry
rotation speed was 250 msecs. Collimation was 0.6 mm. A 100 kV
prospective scan was triggered in the ascending thoracic aorta at
35-75% of the R-R interval. Average HR during the scan was 60 bpm.
The 3D data set was interpreted on a dedicated work station using
MPR, MIP and VRT modes. A total of 80cc of contrast was used.

[Series 6: best diast 73 % · axial · 0.36mm/px · z∈[+1155,+1190]mm · 2 of 266 slices shown, 3 images]
[im 89/266  vessel]
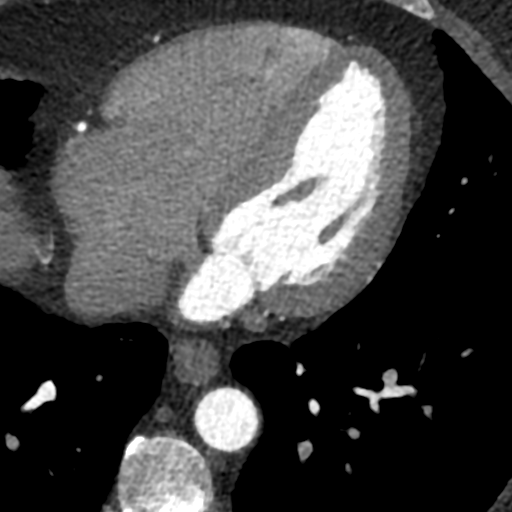
[im 89/266  lung]
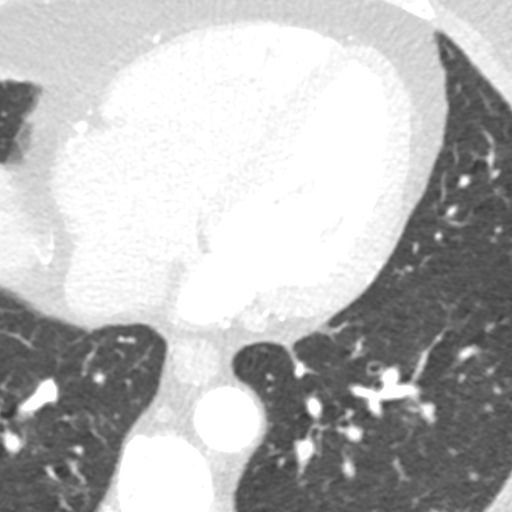
[im 177/266  vessel]
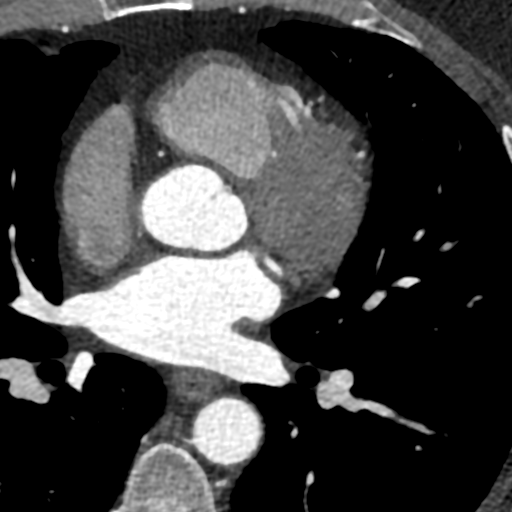

[Series 7: best syst 36 % · axial · 0.36mm/px · z∈[+1155,+1190]mm · 2 of 266 slices shown]
[im 89/266  vessel]
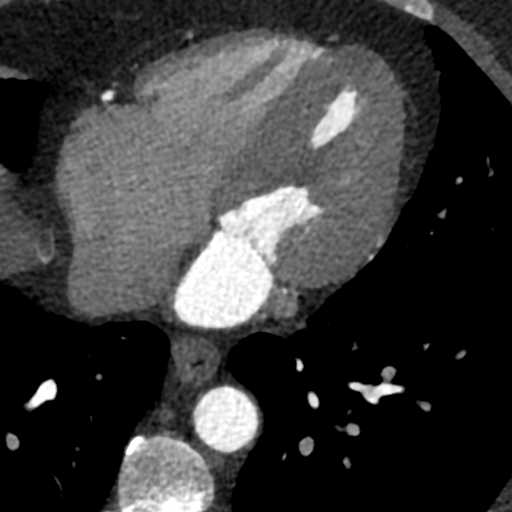
[im 177/266  vessel]
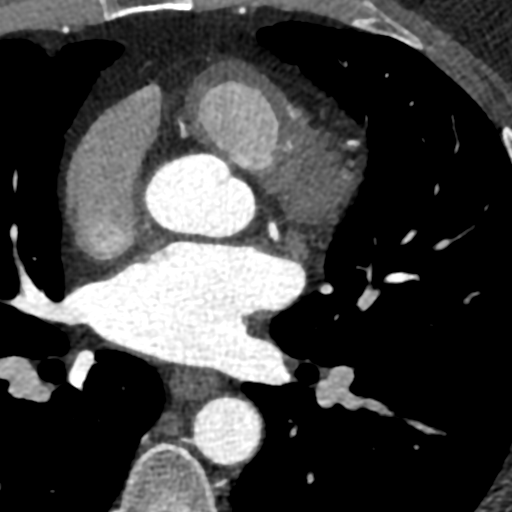

[Series 8: ts diast sharp 73 % · axial · 0.36mm/px · z∈[+1155,+1190]mm · 2 of 266 slices shown]
[im 89/266  lung]
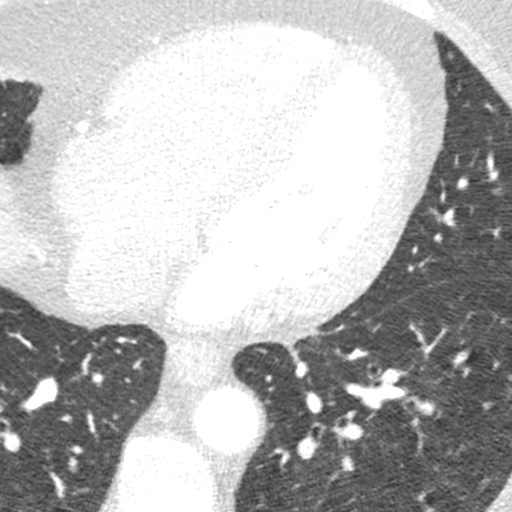
[im 177/266  lung]
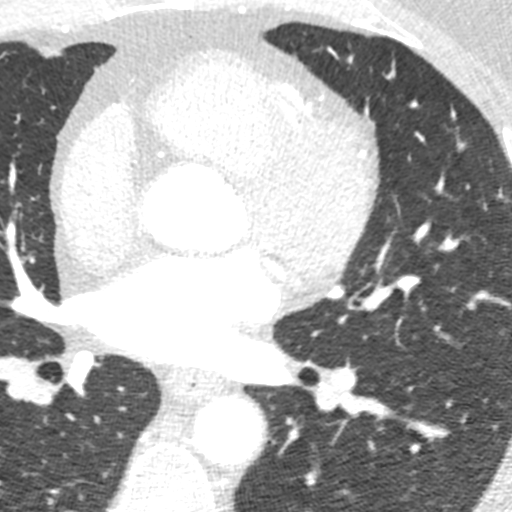

[Series 9: ts syst sharp 36 % · axial · 0.36mm/px · z∈[+1155,+1190]mm · 2 of 266 slices shown]
[im 89/266  lung]
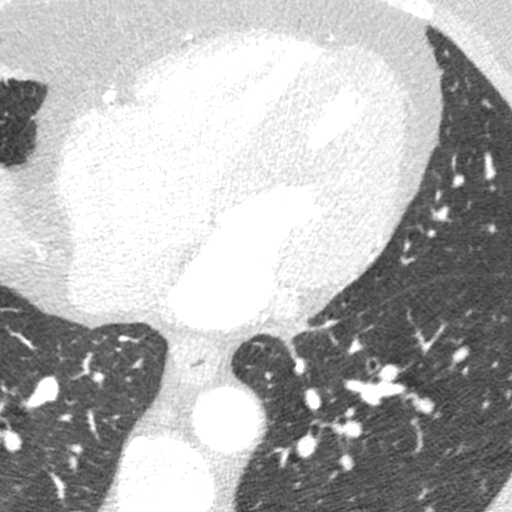
[im 177/266  lung]
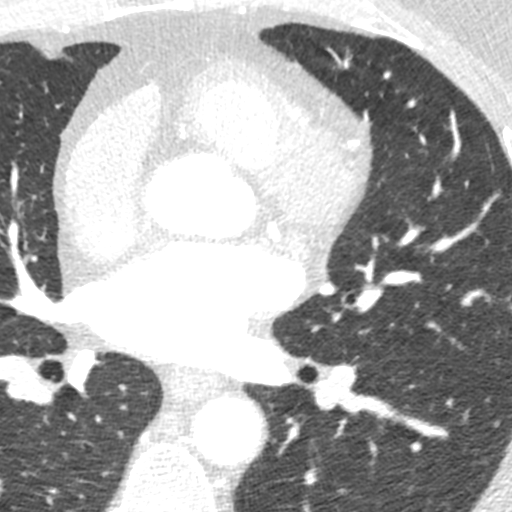

[8 of 20 positions shown; findings below may reference images not displayed]

FINDINGS: Aortic atherosclerosis. Within the visualized portions of the thorax
there are no suspicious appearing pulmonary nodules or masses, there
is no acute consolidative airspace disease, no pleural effusions, no
pneumothorax and no lymphadenopathy. Visualized portions of the
upper abdomen demonstrates a subcentimeter low-attenuation lesion in
the superior aspect of segment 8 of the liver, too small to
characterize, but statistically likely to represent a tiny cyst.
There are no aggressive appearing lytic or blastic lesions noted in
the visualized portions of the skeleton.
IMPRESSION: 1.  Aortic Atherosclerosis (37UFR-9A8.8).
FINDINGS: Non-cardiac: See separate report from [REDACTED].

Possible small PFO. No LA appendage thrombus. Pulmonary veins drain
normally to the left atrium.

Calcium Score: 0 Agatston units.

Coronary Arteries: Right dominant with no anomalies

LM: No plaque or stenosis.

LAD system: No plaque or stenosis. There is a segment of myocardial
bridging in the mid LAD.

Circumflex system: No plaque or stenosis.

RCA system: No plaque or stenosis.
IMPRESSION: 1. Coronary artery calcium score 0 Agatston units, suggesting low
risk for future cardiac events.

2. No significant coronary disease noted. There is a segment of
myocardial bridging in the mid LAD.

Carman Chee

*** End of Addendum ***
EXAM:
OVER-READ INTERPRETATION  CT CHEST

The following report is an over-read performed by radiologist Dr.
Sandip Tiger [REDACTED] on 04/29/2020. This
over-read does not include interpretation of cardiac or coronary
anatomy or pathology. The coronary calcium score/coronary CTA
interpretation by the cardiologist is attached.
FINDINGS: Aortic atherosclerosis. Within the visualized portions of the thorax
there are no suspicious appearing pulmonary nodules or masses, there
is no acute consolidative airspace disease, no pleural effusions, no
pneumothorax and no lymphadenopathy. Visualized portions of the
upper abdomen demonstrates a subcentimeter low-attenuation lesion in
the superior aspect of segment 8 of the liver, too small to
characterize, but statistically likely to represent a tiny cyst.
There are no aggressive appearing lytic or blastic lesions noted in
the visualized portions of the skeleton.
IMPRESSION: 1.  Aortic Atherosclerosis (37UFR-9A8.8).

## 2022-03-23 ENCOUNTER — Other Ambulatory Visit (HOSPITAL_COMMUNITY): Payer: Self-pay | Admitting: Cardiology

## 2022-04-07 ENCOUNTER — Ambulatory Visit (INDEPENDENT_AMBULATORY_CARE_PROVIDER_SITE_OTHER): Payer: Managed Care, Other (non HMO) | Admitting: Family Medicine

## 2022-04-07 ENCOUNTER — Encounter: Payer: Self-pay | Admitting: Family Medicine

## 2022-04-07 ENCOUNTER — Other Ambulatory Visit (HOSPITAL_BASED_OUTPATIENT_CLINIC_OR_DEPARTMENT_OTHER): Payer: Self-pay

## 2022-04-07 VITALS — BP 118/84 | HR 86 | Temp 98.2°F | Ht 70.0 in | Wt 255.2 lb

## 2022-04-07 DIAGNOSIS — Z125 Encounter for screening for malignant neoplasm of prostate: Secondary | ICD-10-CM | POA: Diagnosis not present

## 2022-04-07 DIAGNOSIS — I10 Essential (primary) hypertension: Secondary | ICD-10-CM | POA: Diagnosis not present

## 2022-04-07 DIAGNOSIS — E785 Hyperlipidemia, unspecified: Secondary | ICD-10-CM | POA: Diagnosis not present

## 2022-04-07 DIAGNOSIS — Z23 Encounter for immunization: Secondary | ICD-10-CM | POA: Diagnosis not present

## 2022-04-07 DIAGNOSIS — I700 Atherosclerosis of aorta: Secondary | ICD-10-CM | POA: Diagnosis not present

## 2022-04-07 MED ORDER — LOSARTAN POTASSIUM 50 MG PO TABS
50.0000 mg | ORAL_TABLET | Freq: Every day | ORAL | 3 refills | Status: DC
  Filled 2022-04-07: qty 90, 90d supply, fill #0
  Filled 2022-07-10: qty 90, 90d supply, fill #1
  Filled 2022-10-05: qty 90, 90d supply, fill #2
  Filled 2023-01-17: qty 90, 90d supply, fill #3

## 2022-04-07 MED ORDER — ROSUVASTATIN CALCIUM 10 MG PO TABS
10.0000 mg | ORAL_TABLET | Freq: Every day | ORAL | 3 refills | Status: DC
  Filled 2022-04-07: qty 90, 90d supply, fill #0
  Filled 2022-07-10: qty 90, 90d supply, fill #1
  Filled 2022-10-05: qty 90, 90d supply, fill #2
  Filled 2023-01-31: qty 90, 90d supply, fill #3

## 2022-04-07 MED ORDER — HYDROCHLOROTHIAZIDE 25 MG PO TABS
25.0000 mg | ORAL_TABLET | Freq: Every day | ORAL | 3 refills | Status: DC
  Filled 2022-04-07: qty 90, 90d supply, fill #0
  Filled 2022-07-10: qty 90, 90d supply, fill #1
  Filled 2022-10-05: qty 90, 90d supply, fill #2
  Filled 2023-01-31: qty 90, 90d supply, fill #3

## 2022-04-07 NOTE — Addendum Note (Signed)
Addended by: Clyde Lundborg A on: 04/07/2022 08:24 AM   Modules accepted: Orders

## 2022-04-07 NOTE — Patient Instructions (Addendum)
Call to schedule your colonoscopy!   Thanks for doing final shingrix and flu shot today  - he is going to restart the hctz and update me on blood pressure at home in 1 week- or drop off when you come by for labs with naem and DOB on it  Schedule a lab visit at the check out desk within 2 weeks. Return for future fasting labs meaning nothing but water after midnight please. Ok to take your medications with water.   Recommended follow up: Return in about 6 months (around 10/06/2022) for physical or sooner if needed.Schedule b4 you leave. Or at latest 12 months for physical

## 2022-04-07 NOTE — Progress Notes (Signed)
Phone [phone] In person visit   Subjective:   Richard French is a 56 y.o. year old very pleasant male patient who presents for/with See problem oriented charting Chief Complaint  Patient presents with   Hypertension    Pt states his bp is just fine he does not check it at home.    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   Edema 11/05/2017    Priority: 1.    Medications- reviewed and updated Current Outpatient Medications  Medication Sig Dispense Refill   Cetirizine HCl (ZYRTEC PO) Take by mouth.     co-enzyme Q-10 30 MG capsule Take 100 mg by mouth daily.     acetaminophen (TYLENOL) 500 MG tablet Take 500 mg by mouth every 6 (six) hours as needed. (Patient not taking: Reported on 04/07/2022)     Aspirin-Acetaminophen-Caffeine (EXCEDRIN EXTRA STRENGTH PO) Take 1 tablet by mouth daily. Foe headaches (Patient not taking: Reported on 04/07/2022)     hydrochlorothiazide (HYDRODIURIL) 25 MG tablet Take 1 tablet (25 mg total) by mouth daily. 90 tablet 3   losartan (COZAAR) 50 MG tablet Take 1 tablet (50 mg total) by mouth daily. 90 tablet 3   rosuvastatin (CRESTOR) 10 MG tablet Take 1 tablet (10 mg total) by mouth daily. 90 tablet 3   No current facility-administered medications for this visit.     Objective:  BP 118/84   Pulse 86   Temp 98.2 F (36.8 C)   Ht '5\' 10"'$  (1.778 m)   Wt 255 lb 3.2 oz (115.8 kg)   SpO2 98%   BMI 36.62 kg/m  Gen:  NAD, resting comfortably CV: RRR no murmurs rubs or gallops Lungs: CTAB no crackles, wheeze, rhonchi Ext: trace edema Skin: warm, dry     Assessment and Plan   #hypertension S: medication: Hydrochlorothiazide 25 mg, losartan 50 mg (improvement in cough/chest discomfort) -Blood pressure slightly high with diastolic of 90 this morning but has not taken blood pressure medicine yet today Home readings #s: no recent checks -exercise could improve- walking 3x a week but wants to increase to daily BP Readings from Last 3 Encounters:  04/07/22 118/84  04/29/20 103/64  04/27/20 124/84  A/P: blood pressure really not too bad considering out of hctz for a few weeks and last dose of losartan yesterday morning- he is going to restart the hctz and update me on blood pressure at home in 1 week  #hyperlipidemia-CT coronary morphology with Dr. 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 #Aortic atherosclerosis S: Medication:Rosuvastatin 10 mg daily also takes coenzyme every 10 Lab Results  Component Value Date   CHOL 133 12/27/2020   HDL 38 (L) 12/27/2020   LDLCALC 66 12/27/2020   TRIG 145 12/27/2020   CHOLHDL 3.5 12/27/2020   A/P: Lipids were well controlled for aortic  atherosclerosis goal of 70 or less last year-suspect stable-continue current medication but update lipid panel when he comes back for labs fasting  # history of adenomatous colon polyp and family history of colon cancer paternal uncle- q3 year colonoscopy - he will call to schedule- last in 2019  #Family history of prostate cancer-in maternal uncle-he wants to scan annually with PSA- will do with labs   #Ulnar entrapment issues- made some modifications at home and helpful- better overall  Recommended follow up: Return in about 6 months (around 10/06/2022) for physical or sooner if needed.Schedule b4 you leave.  Lab/Order associations: coffee with  creamer   ICD-10-CM   1. Aortic atherosclerosis (HCC)  I70.0     2. Essential hypertension  I10     3. Hyperlipidemia, unspecified hyperlipidemia type  E78.5 CBC with Differential/Platelet    Comprehensive metabolic panel    Lipid panel    4. Need for immunization against influenza  Z23 Flu Vaccine QUAD 31moIM (Fluarix, Fluzone & Alfiuria Quad PF)    5. Screening for prostate cancer  Z12.5 PSA     Meds ordered this encounter  Medications   losartan (COZAAR) 50 MG tablet    Sig: Take 1 tablet (50 mg total) by mouth daily.    Dispense:  90 tablet    Refill:  3   rosuvastatin (CRESTOR) 10 MG tablet    Sig: Take 1 tablet (10 mg total) by mouth daily.    Dispense:  90 tablet    Refill:  3   hydrochlorothiazide (HYDRODIURIL) 25 MG tablet    Sig: Take 1 tablet (25 mg total) by mouth daily.    Dispense:  90 tablet    Refill:  3   Return precautions advised.  SGarret Reddish MD

## 2022-04-10 ENCOUNTER — Other Ambulatory Visit: Payer: Managed Care, Other (non HMO)

## 2022-04-19 ENCOUNTER — Encounter: Payer: Self-pay | Admitting: Internal Medicine

## 2022-04-19 ENCOUNTER — Encounter: Payer: Self-pay | Admitting: Family Medicine

## 2022-04-19 ENCOUNTER — Ambulatory Visit (INDEPENDENT_AMBULATORY_CARE_PROVIDER_SITE_OTHER): Payer: Managed Care, Other (non HMO) | Admitting: Family Medicine

## 2022-04-19 VITALS — BP 128/78 | HR 75 | Temp 97.1°F | Ht 70.0 in | Wt 253.8 lb

## 2022-04-19 DIAGNOSIS — Z125 Encounter for screening for malignant neoplasm of prostate: Secondary | ICD-10-CM

## 2022-04-19 DIAGNOSIS — M6208 Separation of muscle (nontraumatic), other site: Secondary | ICD-10-CM | POA: Diagnosis not present

## 2022-04-19 DIAGNOSIS — E785 Hyperlipidemia, unspecified: Secondary | ICD-10-CM

## 2022-04-19 DIAGNOSIS — I10 Essential (primary) hypertension: Secondary | ICD-10-CM | POA: Diagnosis not present

## 2022-04-19 LAB — LIPID PANEL
Cholesterol: 127 mg/dL (ref 0–200)
HDL: 49 mg/dL (ref >39.00)
LDL Cholesterol: 64 mg/dL (ref 0–99)
NonHDL: 77.73
Total CHOL/HDL Ratio: 3
Triglycerides: 70 mg/dL (ref 0.0–149.0)
VLDL: 14 mg/dL (ref 0.0–40.0)

## 2022-04-19 LAB — COMPREHENSIVE METABOLIC PANEL
ALT: 27 U/L (ref 0–53)
AST: 16 U/L (ref 0–37)
Albumin: 4.4 g/dL (ref 3.5–5.2)
Alkaline Phosphatase: 70 U/L (ref 39–117)
BUN: 14 mg/dL (ref 6–23)
CO2: 30 mEq/L (ref 19–32)
Calcium: 9.2 mg/dL (ref 8.4–10.5)
Chloride: 102 mEq/L (ref 96–112)
Creatinine, Ser: 0.93 mg/dL (ref 0.40–1.50)
GFR: 93.24 mL/min (ref >60.00)
Glucose, Bld: 101 mg/dL — ABNORMAL HIGH (ref 70–99)
Potassium: 4 mEq/L (ref 3.5–5.1)
Sodium: 140 mEq/L (ref 135–145)
Total Bilirubin: 0.4 mg/dL (ref 0.2–1.2)
Total Protein: 6.8 g/dL (ref 6.0–8.3)

## 2022-04-19 LAB — CBC WITH DIFFERENTIAL/PLATELET
Basophils Absolute: 0.1 10*3/uL (ref 0.0–0.1)
Basophils Relative: 0.7 % (ref 0.0–3.0)
Eosinophils Absolute: 0 10*3/uL (ref 0.0–0.7)
Eosinophils Relative: 0.3 % (ref 0.0–5.0)
HCT: 46.6 % (ref 39.0–52.0)
Hemoglobin: 15.9 g/dL (ref 13.0–17.0)
Lymphocytes Relative: 18.8 % (ref 12.0–46.0)
Lymphs Abs: 2.2 10*3/uL (ref 0.7–4.0)
MCHC: 34.1 g/dL (ref 30.0–36.0)
MCV: 87.2 fl (ref 78.0–100.0)
Monocytes Absolute: 0.7 10*3/uL (ref 0.1–1.0)
Monocytes Relative: 5.6 % (ref 3.0–12.0)
Neutro Abs: 8.7 10*3/uL — ABNORMAL HIGH (ref 1.4–7.7)
Neutrophils Relative %: 74.6 % (ref 43.0–77.0)
Platelets: 374 10*3/uL (ref 150.0–400.0)
RBC: 5.34 Mil/uL (ref 4.22–5.81)
RDW: 13.6 % (ref 11.5–15.5)
WBC: 11.7 10*3/uL — ABNORMAL HIGH (ref 4.0–10.5)

## 2022-04-19 LAB — PSA: PSA: 0.65 ng/mL (ref 0.10–4.00)

## 2022-04-19 NOTE — Patient Instructions (Addendum)
[facility] contact Please call to schedule visit and/or procedure Address: [address], [place], [place] [address] Phone: [phone]    Poor control or reflux- recommended omeprazole before breakfast daily and if symptoms do not resolve consider GI consult   Happy to place surgical referral for you if you change your mind or if you start having pain/discomfort or further worsening  Recommend 2 weeks no tylenol then restart max twice a week   Please stop by lab before you go- release prior labs If you have mychart- we will send your results within 3 business days of [place] receiving them.  If you do not have mychart- we will call you about results within 5 business days of [place] receiving them.  *please also note that you will see labs on mychart as soon as they post. I will later go in and write notes on them- will say "notes from Dr. Yong Channel"   Recommended follow up: Return in about 6 months (around 10/18/2022) for physical or sooner if needed.Schedule b4 you leave.

## 2022-04-19 NOTE — Progress Notes (Signed)
Phone [phone] In person visit   Subjective:   Richard French is a 56 y.o. year old very pleasant male patient who presents for/with See problem oriented charting Chief Complaint  Patient presents with   hernia    Pt would like to discuss epigastric hernia that is bothering him and he just noticed it the other night that is causing him discomfort.   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   Edema 11/05/2017    Priority: 1.    Medications- reviewed and updated Current Outpatient Medications  Medication Sig Dispense Refill   acetaminophen (TYLENOL) 500 MG tablet Take 500 mg by mouth every 6 (six) hours as needed.     Aspirin-Acetaminophen-Caffeine (EXCEDRIN EXTRA STRENGTH PO) Take 1 tablet by mouth daily. Foe headaches     Cetirizine HCl (ZYRTEC PO) Take by mouth.     co-enzyme Q-10 30 MG capsule Take 100 mg by mouth daily.     hydrochlorothiazide (HYDRODIURIL) 25 MG tablet Take 1 tablet (25 mg total) by mouth daily. 90 tablet 3   losartan (COZAAR) 50 MG tablet Take 1 tablet (50 mg total) by mouth daily. 90 tablet 3   rosuvastatin (CRESTOR) 10 MG tablet Take 1 tablet (10 mg total) by mouth daily. 90 tablet 3   No current facility-administered medications for this visit.     Objective:  BP 128/78   Pulse 75   Temp (!) 97.1 F (36.2 C)   Ht '5\' 10"'$  (1.778 m)   Wt 253 lb 12.8 oz (115.1 kg)    SpO2 98%   BMI 36.42 kg/m  Gen: NAD, resting comfortably CV: RRR no murmurs rubs or gallops Lungs: CTAB no crackles, wheeze, rhonchi Abdomen: diastasis recti with sittin gup from above umbilicus to epigastric area at least 8 cm apart or more. soft/nontender/nondistended/normal bowel sounds. No rebound or guarding.  Ext: 1+ edema Skin: warm, dry     Assessment and Plan   # Epigastric hernia? S: Patient noted bulge in upper abdomen with sittin gup- has likely been there sometime but wife noted with his shirt off (he usually has shirt on). No pain/discomfort and not enlarging A/P: appears to be diastasis recti. Offered PT - he declines. Can refer to general surgeyr if worsening or pain or if he simply wants to discuss procedure   # GERD S:Medication: omeprazole some days years ago and zegerid occasionally- wakes up at times with regurgitation every 2 weeks and even vomiting rarely.  A/P: Poor control or reflux- recommended omeprazole before breakfast daily and if symptoms do not resolve consider GI consult   #hypertension S: medication: Hydrochlorothiazide 25 mg, losartan 50 mg  BP Readings from Last 3 Encounters:  04/19/22 128/78  04/07/22 118/84  04/29/20 103/64  A/P: blood pressure looks great back on hctz and losartan- continue current  meds  #hyperlipidemia-CT coronary morphology with Dr. 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 17 hours a day of work.   #Family history of prostate cancer-in maternal uncle-he wants to scan annually- will check psa today  Lab Results   Component Value Date   PSA 1.23 04/27/2020   PSA 1.59 11/05/2017    Recommended follow up: Return in about 6 months (around 10/18/2022) for physical or sooner if needed.Schedule b4 you leave.  Lab/Order associations:   ICD-10-CM   1. Diastasis recti  M62.08     2. Essential hypertension  I10     3. Hyperlipidemia, unspecified hyperlipidemia type  E78.5      Return precautions advised.  Garret Reddish, MD

## 2022-05-24 ENCOUNTER — Encounter: Payer: Self-pay | Admitting: *Deleted

## 2022-06-14 ENCOUNTER — Ambulatory Visit: Payer: Managed Care, Other (non HMO) | Admitting: Internal Medicine

## 2022-07-26 ENCOUNTER — Other Ambulatory Visit (HOSPITAL_BASED_OUTPATIENT_CLINIC_OR_DEPARTMENT_OTHER): Payer: Self-pay

## 2022-07-26 ENCOUNTER — Other Ambulatory Visit (INDEPENDENT_AMBULATORY_CARE_PROVIDER_SITE_OTHER): Payer: Managed Care, Other (non HMO)

## 2022-07-26 ENCOUNTER — Ambulatory Visit: Payer: Managed Care, Other (non HMO) | Admitting: Internal Medicine

## 2022-07-26 ENCOUNTER — Encounter: Payer: Self-pay | Admitting: Internal Medicine

## 2022-07-26 VITALS — BP 124/78 | HR 87 | Ht 71.0 in | Wt 257.0 lb

## 2022-07-26 DIAGNOSIS — K2100 Gastro-esophageal reflux disease with esophagitis, without bleeding: Secondary | ICD-10-CM | POA: Diagnosis not present

## 2022-07-26 DIAGNOSIS — R0789 Other chest pain: Secondary | ICD-10-CM | POA: Diagnosis not present

## 2022-07-26 DIAGNOSIS — R1010 Upper abdominal pain, unspecified: Secondary | ICD-10-CM | POA: Diagnosis not present

## 2022-07-26 DIAGNOSIS — M6208 Separation of muscle (nontraumatic), other site: Secondary | ICD-10-CM

## 2022-07-26 DIAGNOSIS — Z86010 Personal history of colonic polyps: Secondary | ICD-10-CM

## 2022-07-26 LAB — COMPREHENSIVE METABOLIC PANEL
ALT: 33 U/L (ref 0–53)
AST: 26 U/L (ref 0–37)
Albumin: 4.4 g/dL (ref 3.5–5.2)
Alkaline Phosphatase: 69 U/L (ref 39–117)
BUN: 14 mg/dL (ref 6–23)
CO2: 27 mEq/L (ref 19–32)
Calcium: 9.7 mg/dL (ref 8.4–10.5)
Chloride: 107 mEq/L (ref 96–112)
Creatinine, Ser: 0.99 mg/dL (ref 0.40–1.50)
GFR: 86.34 mL/min (ref >60.00)
Glucose, Bld: 95 mg/dL (ref 70–99)
Potassium: 4.7 mEq/L (ref 3.5–5.1)
Sodium: 142 mEq/L (ref 135–145)
Total Bilirubin: 0.4 mg/dL (ref 0.2–1.2)
Total Protein: 7.2 g/dL (ref 6.0–8.3)

## 2022-07-26 MED ORDER — NA SULFATE-K SULFATE-MG SULF 17.5-3.13-1.6 GM/177ML PO SOLN
1.0000 | Freq: Once | ORAL | 0 refills | Status: AC
  Filled 2022-07-26 – 2022-08-11 (×2): qty 354, 1d supply, fill #0

## 2022-07-26 NOTE — Progress Notes (Signed)
Patient ID: Richard French, male   DOB: Nov 25, 1965, 57 y.o.   MRN: [id number] HPI: Richard French is a 57 year old male with a history of sessile serrated and adenomatous colon polyps, GERD, colonic diverticulosis, hypertension, hyperlipidemia who is seen to evaluate upper abdominal pain, concern for hernia, reflux and for colon polyp surveillance.  He is here alone today but his wife was present by speaker phone during the entire encounter.  He reports that he has frequent issues with heartburn at least a couple of days a week.  This is worse with spicy foods and red sauces.  He will wake up 1 or 2 times per month coughing with near aspiration during the night.  He will preemptively taken omeprazole 20 mg on evenings when he eats inciting foods such as pizza.  He does not have frequent heartburn during the day.  He did previously have more heartburn than he does now.  He denies dysphagia and odynophagia.  Denies nausea and vomiting.  He has developed a "hernia" or bulging in the upper abdomen and severe tenderness at the epigastrium/xiphoid process.  This is very noticeable if he touches this area or if the dog jumped on this area while he is in bed.  Also worse with lifting.  He wonders if he exacerbated this during an episode of vomiting.  Bowel movements are often loose but typically once in the morning.  Occasionally he can have additional bowel movements in the evening.  He has frequent flatulence which is not new for him.  Past Medical History:  Diagnosis Date   Arthritis    hand   Basal cell carcinoma    back and buttocks   Blood pressure elevated without history of HTN    tx w/hctz   Cancer (HCC)    CHF (congestive heart failure) (HCC)    Diverticulosis    GERD (gastroesophageal reflux disease)    Headache    otc med PRN   History of chicken pox    Had it twice   Internal hemorrhoids    MVP (mitral valve prolapse)    not heard by cardiology- only heard with [facility] in 1993    Seasonal allergies     Past Surgical History:  Procedure Laterality Date   BASAL CELL CARCINOMA EXCISION N/A 2017   From back, lower buttocks   COLONOSCOPY     EYE SURGERY Bilateral    Lasik   WISDOM TOOTH EXTRACTION      Outpatient Medications Prior to Visit  Medication Sig Dispense Refill   acetaminophen (TYLENOL) 500 MG tablet Take 500 mg by mouth every 6 (six) hours as needed.     Aspirin-Acetaminophen-Caffeine (EXCEDRIN EXTRA STRENGTH PO) Take 1 tablet by mouth daily. Foe headaches     Cetirizine HCl (ZYRTEC PO) Take by mouth daily.     co-enzyme Q-10 30 MG capsule Take 100 mg by mouth daily.     hydrochlorothiazide (HYDRODIURIL) 25 MG tablet Take 1 tablet (25 mg total) by mouth daily. 90 tablet 3   losartan (COZAAR) 50 MG tablet Take 1 tablet (50 mg total) by mouth daily. 90 tablet 3   rosuvastatin (CRESTOR) 10 MG tablet Take 1 tablet (10 mg total) by mouth daily. 90 tablet 3   No facility-administered medications prior to visit.    No Known Allergies  Family History  Problem Relation Age of Onset   Hypertension Mother        14 in 2021   Hyperlipidemia Mother    Anxiety  disorder Mother    COPD Father        o2 dependent. 82 in 2021. former smoker   Skin cancer Father    Healthy Brother        29 in 2021   Dementia Maternal Grandmother    Hearing loss Maternal Grandfather    Heart disease Maternal Grandfather        sudden heart attack 19   Cancer Paternal Grandmother        Abdominal Cancer   Heart disease Paternal Grandfather        aortic aneurysm- smoker   Healthy Daughter    Healthy Son    Cancer Paternal Aunt        Abdominal   Colon cancer Paternal Uncle    Breast cancer Other        on multiple male family members   Colon polyps Neg Hx    Rectal cancer Neg Hx    Stomach cancer Neg Hx     Social History   Tobacco Use   Smoking status: Never   Smokeless tobacco: Never  Vaping Use   Vaping Use: Never used  Substance Use Topics   Alcohol  use: Yes    Alcohol/week: 1.0 - 2.0 standard drink of alcohol    Types: 1 - 2 Cans of beer per week    Comment: couple times a month   Drug use: No    ROS: As per history of present illness, otherwise negative  BP 124/78   Pulse 87   Ht '5\' 11"'$  (1.803 m)   Wt 257 lb (116.6 kg)   BMI 35.84 kg/m  Gen: awake, alert, NAD HEENT: anicteric  CV: RRR, no mrg; significant tenderness over the inferior sternum and xiphoid process Pulm: CTA b/l Abd: soft, diastases recti present without palpable hernia, no tenderness in the epigastrium though significantly tender over the xiphoid and distal sternum, ND, obese, +BS throughout Ext: no c/c/e Neuro: nonfocal   RELEVANT LABS AND IMAGING: CBC    Component Value Date/Time   WBC 11.7 (H) 04/19/2022 0910   RBC 5.34 04/19/2022 0910   HGB 15.9 04/19/2022 0910   HCT 46.6 04/19/2022 0910   PLT 374.0 04/19/2022 0910   MCV 87.2 04/19/2022 0910   MCH 29.7 12/22/2019 1551   MCHC 34.1 04/19/2022 0910   RDW 13.6 04/19/2022 0910   LYMPHSABS 2.2 04/19/2022 0910   MONOABS 0.7 04/19/2022 0910   EOSABS 0.0 04/19/2022 0910   BASOSABS 0.1 04/19/2022 0910    CMP     Component Value Date/Time   NA 142 07/26/2022 1035   K 4.7 07/26/2022 1035   CL 107 07/26/2022 1035   CO2 27 07/26/2022 1035   GLUCOSE 95 07/26/2022 1035   BUN 14 07/26/2022 1035   CREATININE 0.99 07/26/2022 1035   CALCIUM 9.7 07/26/2022 1035   PROT 7.2 07/26/2022 1035   ALBUMIN 4.4 07/26/2022 1035   AST 26 07/26/2022 1035   ALT 33 07/26/2022 1035   ALKPHOS 69 07/26/2022 1035   BILITOT 0.4 07/26/2022 1035   GFRNONAA >60 12/27/2020 1605   GFRAA >60 01/20/2020 1407    ASSESSMENT/PLAN:  57 year old male with a history of sessile serrated and adenomatous colon polyps, GERD, colonic diverticulosis, hypertension, hyperlipidemia who is seen to evaluate upper abdominal pain, concern for hernia, reflux and for colon polyp surveillance.  GERD --he does have significant GERD and  regurgitation symptoms for many years.  He is not taking any antacid medication on a consistent basis.  I recommended we perform upper endoscopy to evaluate for esophagitis and exclude Barrett's esophagus.  We discussed how PPI is not a great as needed acid suppression and I would prefer he use H2 blocker for these instances.  Will determine if he needs a more chronic acid suppression therapy based on upper endoscopy result -- EGD in the [place] -- Famotidine 20 to 40 mg in the evenings or at bedtime on an as-needed basis  2.  Diastases recti/sternal pain/upper abdominal pain--his pain is really at the distal sternal cartilage/xiphoid process.  Query if there was injury during one of his retching or vomiting episodes or if there is costochondritis.  Diastases recti is not a true hernia and we discussed this today.  However given this pain and tenderness we will proceed with CT scan of the abdomen -- CT scan abdomen pelvis with contrast  3.  History of sessile serrated and adenomatous colon polyps --2 adenomas and 1 SSP at colonoscopy in October 2019 -- Surveillance colonoscopy recommended at this time in the [facility] -- We reviewed the risk, benefits and alternatives to both upper and lower endoscopy he is agreeable and wishes to proceed  [id number], Brayton Mars, [address],  [place] [address]

## 2022-07-26 NOTE — Patient Instructions (Signed)
You have been scheduled for a colonoscopy and EGD. Please follow written instructions given to you at your visit today.  Please pick up your prep supplies at the pharmacy within the next 1-3 days. If you use inhalers (even only as needed), please bring them with you on the day of your procedure.   Your provider has requested that you go to the basement level for lab work before leaving today. Press "B" on the elevator. The lab is located at the first door on the left as you exit the elevator.   You have been scheduled for a CT scan of the abdomen and pelvis at [facility], 1st floor Radiology. You are scheduled on 08/09/2022 at 3:30pm. You should arrive at 1:30pm to drink the contrast.  Please follow the written instructions below on the day of your exam:   1) Do not eat anything after 11:30am (4 hours prior to your test)   The purpose of you drinking the oral contrast is to aid in the visualization of your intestinal tract. The contrast solution may cause some diarrhea. Depending on your individual set of symptoms, you may also receive an intravenous injection of x-ray contrast/dye. Plan on being at [facility] for 45 minutes or longer, depending on the type of exam you are having performed.   If you have any questions regarding your exam or if you need to reschedule, you may call [facility] at [phone] between the hours of 8:00 am and 5:00 pm, Monday-Friday.    Due to recent changes in healthcare laws, you may see the results of your imaging and laboratory studies on MyChart before your provider has had a chance to review them.  We understand that in some cases there may be results that are confusing or concerning to you. Not all laboratory results come back in the same time frame and the provider may be waiting for multiple results in order to interpret others.  Please give [place] 48 hours in order for your provider to thoroughly review all the results before contacting the  office for clarification of your results.      _______________________________________________________  If your blood pressure at your visit was 140/90 or greater, please contact your primary care physician to follow up on this.  _______________________________________________________  If you are age 18 or older, your body mass index should be between 23-30. Your Body mass index is 35.84 kg/m. If this is out of the aforementioned range listed, please consider follow up with your Primary Care Provider.  If you are age 74 or younger, your body mass index should be between 19-25. Your Body mass index is 35.84 kg/m. If this is out of the aformentioned range listed, please consider follow up with your Primary Care Provider.   ________________________________________________________  The [facility] providers would like to encourage you to use [facility] to communicate with providers for non-urgent requests or questions.  Due to long hold times on the telephone, sending your provider a message by [facility] may be a faster and more efficient way to get a response.  Please allow 48 business hours for a response.  Please remember that this is for non-urgent requests.  _______________________________________________________ It was a pleasure to see you today!  Thank you for trusting me with your gastrointestinal care!

## 2022-08-01 ENCOUNTER — Other Ambulatory Visit: Payer: Self-pay

## 2022-08-01 ENCOUNTER — Telehealth: Payer: Self-pay

## 2022-08-01 DIAGNOSIS — R1010 Upper abdominal pain, unspecified: Secondary | ICD-10-CM

## 2022-08-01 NOTE — Telephone Encounter (Signed)
Left detailed message on pts voicemail. Appt cancelled at hospital. Left message that pt will get a call from [facility] to set up another CT scan appt.

## 2022-08-01 NOTE — Telephone Encounter (Signed)
-----   Message from April D McPeak sent at 08/01/2022  2:02 PM EST ----- Vaughan Basta, Patient's insurance would not approve [facility] or [facility]. Got the auth for [facility]. Authorization [id number] Can you please let the patient know?  Thank you.

## 2022-08-03 ENCOUNTER — Other Ambulatory Visit (HOSPITAL_BASED_OUTPATIENT_CLINIC_OR_DEPARTMENT_OTHER): Payer: Self-pay

## 2022-08-09 ENCOUNTER — Ambulatory Visit (HOSPITAL_COMMUNITY): Payer: Managed Care, Other (non HMO)

## 2022-08-11 ENCOUNTER — Other Ambulatory Visit (HOSPITAL_BASED_OUTPATIENT_CLINIC_OR_DEPARTMENT_OTHER): Payer: Self-pay

## 2022-09-11 ENCOUNTER — Telehealth: Payer: Self-pay | Admitting: Internal Medicine

## 2022-09-11 NOTE — Telephone Encounter (Signed)
-----   Message from Chrystie Nose, RN sent at 09/11/2022  9:36 AM EDT ----- Regarding: FW: EGD Denied See note below from April McPeak regarding EGD  ----- Message ----- From: Franz Dell Sent: 09/11/2022   7:24 AM EDT To: Chrystie Nose, RN Subject: EGD Denied                                     We received a fax from [facility] stating they denied this pt's EGD. The denial letter in scanned into the pt's chart under media. On there is listed the reason why, the phone number to call to schedule a peer to peer.   Thanks.

## 2022-09-11 NOTE — Telephone Encounter (Signed)
Peer to peer performed  EGD approved for Barrett's screening in the setting of GERD  Approval #: [id number] go until 03/10/2023  Thanks JMP

## 2022-09-18 ENCOUNTER — Other Ambulatory Visit (HOSPITAL_BASED_OUTPATIENT_CLINIC_OR_DEPARTMENT_OTHER): Payer: Self-pay

## 2022-09-18 ENCOUNTER — Ambulatory Visit (AMBULATORY_SURGERY_CENTER): Payer: Managed Care, Other (non HMO) | Admitting: Internal Medicine

## 2022-09-18 ENCOUNTER — Encounter: Payer: Self-pay | Admitting: Internal Medicine

## 2022-09-18 VITALS — BP 120/72 | HR 71 | Temp 98.6°F | Resp 16 | Ht 71.0 in | Wt 257.0 lb

## 2022-09-18 DIAGNOSIS — K2100 Gastro-esophageal reflux disease with esophagitis, without bleeding: Secondary | ICD-10-CM

## 2022-09-18 DIAGNOSIS — D125 Benign neoplasm of sigmoid colon: Secondary | ICD-10-CM

## 2022-09-18 DIAGNOSIS — D123 Benign neoplasm of transverse colon: Secondary | ICD-10-CM

## 2022-09-18 DIAGNOSIS — Z86010 Personal history of colonic polyps: Secondary | ICD-10-CM | POA: Diagnosis not present

## 2022-09-18 DIAGNOSIS — D122 Benign neoplasm of ascending colon: Secondary | ICD-10-CM | POA: Diagnosis not present

## 2022-09-18 DIAGNOSIS — Z09 Encounter for follow-up examination after completed treatment for conditions other than malignant neoplasm: Secondary | ICD-10-CM | POA: Diagnosis present

## 2022-09-18 DIAGNOSIS — Z860101 Personal history of adenomatous and serrated colon polyps: Secondary | ICD-10-CM

## 2022-09-18 DIAGNOSIS — K635 Polyp of colon: Secondary | ICD-10-CM

## 2022-09-18 DIAGNOSIS — K209 Esophagitis, unspecified without bleeding: Secondary | ICD-10-CM

## 2022-09-18 MED ORDER — SODIUM CHLORIDE 0.9 % IV SOLN: 500.0000 mL | INTRAVENOUS | Status: DC

## 2022-09-18 MED ORDER — PANTOPRAZOLE SODIUM 40 MG PO TBEC
40.0000 mg | DELAYED_RELEASE_TABLET | Freq: Every day | ORAL | 3 refills | Status: DC
Start: 2022-09-18 — End: 2022-09-19
  Filled 2022-09-18: qty 90, 90d supply, fill #0

## 2022-09-18 NOTE — Progress Notes (Signed)
Called to room to assist during endoscopic procedure.  Patient ID and intended procedure confirmed with present staff. Received instructions for my participation in the procedure from the performing physician.  

## 2022-09-18 NOTE — Progress Notes (Signed)
Vss nad trans to pacu 

## 2022-09-18 NOTE — Patient Instructions (Addendum)
-  Handout on polyps, esophagitis provided -await pathology results -repeat colonoscopy for surveillance recommended. Date to be determined when pathology result become available   -Continue present medications   YOU HAD AN ENDOSCOPIC PROCEDURE TODAY AT THE [facility]:   Refer to the procedure report that was given to you for any specific questions about what was found during the examination.  If the procedure report does not answer your questions, please call your gastroenterologist to clarify.  If you requested that your care partner not be given the details of your procedure findings, then the procedure report has been included in a sealed envelope for you to review at your convenience later.  YOU SHOULD EXPECT: Some feelings of bloating in the abdomen. Passage of more gas than usual.  Walking can help get rid of the air that was put into your GI tract during the procedure and reduce the bloating. If you had a lower endoscopy (such as a colonoscopy or flexible sigmoidoscopy) you may notice spotting of blood in your stool or on the toilet paper. If you underwent a bowel prep for your procedure, you may not have a normal bowel movement for a few days.  Please Note:  You might notice some irritation and congestion in your nose or some drainage.  This is from the oxygen used during your procedure.  There is no need for concern and it should clear up in a day or so.  SYMPTOMS TO REPORT IMMEDIATELY:  Following lower endoscopy (colonoscopy or flexible sigmoidoscopy):  Excessive amounts of blood in the stool  Significant tenderness or worsening of abdominal pains  Swelling of the abdomen that is new, acute  Fever of 100F or higher  Following upper endoscopy (EGD)  Vomiting of blood or coffee ground material  New chest pain or pain under the shoulder blades  Painful or persistently difficult swallowing  New shortness of breath  Fever of 100F or higher  Black, tarry-looking  stools  For urgent or emergent issues, a gastroenterologist can be reached at any hour by calling [phone]. Do not use MyChart messaging for urgent concerns.    DIET:  We do recommend a small meal at first, but then you may proceed to your regular diet.  Drink plenty of fluids but you should avoid alcoholic beverages for 24 hours.  ACTIVITY:  You should plan to take it easy for the rest of today and you should NOT DRIVE or use heavy machinery until tomorrow (because of the sedation medicines used during the test).    FOLLOW UP: Our staff will call the number listed on your records the next business day following your procedure.  We will call around 7:15- 8:00 am to check on you and address any questions or concerns that you may have regarding the information given to you following your procedure. If we do not reach you, we will leave a message.     If any biopsies were taken you will be contacted by phone or by letter within the next 1-3 weeks.  Please call us at [phone] if you have not heard about the biopsies in 3 weeks.    SIGNATURES/CONFIDENTIALITY: You and/or your care partner have signed paperwork which will be entered into your electronic medical record.  These signatures attest to the fact that that the information above on your After Visit Summary has been reviewed and is understood.  Full responsibility of the confidentiality of this discharge information lies with you and/or your care-partner.

## 2022-09-18 NOTE — Op Note (Signed)
[facility] Patient Name: Richard French Procedure Date: 09/18/2022 2:14 PM MRN: [id number] Endoscopist: Beverley Fiedler , MD, [id number] Age: 57 Referring MD:  Date of Birth: 10/17/1965 Gender: Male Account #: [id number] Procedure:                Upper GI endoscopy Indications:              Screening for Barrett's esophagus in patient at                            risk for this condition, Gastro-esophageal reflux                            disease Medicines:                Monitored Anesthesia Care Procedure:                Pre-Anesthesia Assessment:                           - Prior to the procedure, a History and Physical                            was performed, and patient medications and                            allergies were reviewed. The patient's tolerance of                            previous anesthesia was also reviewed. The risks                            and benefits of the procedure and the sedation                            options and risks were discussed with the patient.                            All questions were answered, and informed consent                            was obtained. Prior Anticoagulants: The patient has                            taken no anticoagulant or antiplatelet agents. ASA                            Grade Assessment: II - A patient with mild systemic                            disease. After reviewing the risks and benefits,                            the patient was deemed in satisfactory condition to  undergo the procedure.                           After obtaining informed consent, the endoscope was                            passed under direct vision. Throughout the                            procedure, the patient's blood pressure, pulse, and                            oxygen saturations were monitored continuously. The                            Olympus scope [id number] was introduced through the                             mouth, and advanced to the second part of duodenum.                            The upper GI endoscopy was accomplished without                            difficulty. The patient tolerated the procedure                            well. Scope In: Scope Out: Findings:                 LA Grade C (one or more mucosal breaks continuous                            between tops of 2 or more mucosal folds, less than                            75% circumference) esophagitis with no bleeding was                            found in the lower third of the esophagus.                           The entire examined stomach was normal.                           The examined duodenum was normal. Complications:            No immediate complications. Estimated Blood Loss:     Estimated blood loss: none. Impression:               - LA Grade C reflux esophagitis with no bleeding.                           - Normal stomach.                           -  Normal examined duodenum.                           - No specimens collected. Recommendation:           - Patient has a contact number available for                            emergencies. The signs and symptoms of potential                            delayed complications were discussed with the                            patient. Return to normal activities tomorrow.                            Written discharge instructions were provided to the                            patient.                           - Resume previous diet.                           - Continue present medications. Begin pantoprazole                            40 mg once daily. If persistent symptoms repeat EGD                            recommended in 12 weeks to ensure healing. Beverley Fiedler, MD 09/18/2022 2:57:56 PM This report has been signed electronically.

## 2022-09-18 NOTE — Progress Notes (Signed)
GASTROENTEROLOGY PROCEDURE H&P NOTE   Primary Care PhysicShelva Majestictephen O, MD    Reason for Procedure:  History of GERD, screening for Barrett's and history of colon polyps  Plan:    EGD and colonoscopy  Patient is appropriate for endoscopic procedure(s) in the ambulatory ([facility]) setting.  The nature of the procedure, as well as the risks, benefits, and alternatives were carefully and thoroughly reviewed with the patient. Ample time for discussion and questions allowed. The patient understood, was satisfied, and agreed to proceed.     HPI: Richard French is a 57 y.o. male who presents for EGD and colonoscopy.  Medical history as below.  Tolerated the prep.  No recent chest pain or shortness of breath.  No abdominal pain today.  Past Medical History:  Diagnosis Date   Arthritis    hand   Basal cell carcinoma    back and buttocks   Blood pressure elevated without history of HTN    tx w/hctz   Cancer    CHF (congestive heart failure)    Diverticulosis    GERD (gastroesophageal reflux disease)    Headache    otc med PRN   History of chicken pox    Had it twice   Internal hemorrhoids    MVP (mitral valve prolapse)    not heard by cardiology- only heard with military physical in 1993   Seasonal allergies     Past Surgical History:  Procedure Laterality Date   BASAL CELL CARCINOMA EXCISION N/A 2017   From back, lower buttocks   COLONOSCOPY     EYE SURGERY Bilateral    Lasik   WISDOM TOOTH EXTRACTION      Prior to Admission medications   Medication Sig Start Date End Date Taking? Authorizing Provider  Cetirizine HCl (ZYRTEC PO) Take by mouth daily.   Yes [provider]  co-enzyme Q-10 30 MG capsule Take 100 mg by mouth daily.   Yes [provider]  hydrochlorothiazide (HYDRODIURIL) 25 MG tablet Take 1 tablet (25 mg total) by mouth daily. 04/07/22  Yes Shelva Majestic, MD  losartan (COZAAR) 50 MG tablet Take 1 tablet (50 mg total) by mouth  daily. 04/07/22  Yes Shelva Majestic, MD  rosuvastatin (CRESTOR) 10 MG tablet Take 1 tablet (10 mg total) by mouth daily. 04/07/22  Yes Shelva Majestic, MD  acetaminophen (TYLENOL) 500 MG tablet Take 500 mg by mouth every 6 (six) hours as needed.    [provider]  Aspirin-Acetaminophen-Caffeine (EXCEDRIN EXTRA STRENGTH PO) Take 1 tablet by mouth daily. Foe headaches    [provider]    Current Outpatient Medications  Medication Sig Dispense Refill   Cetirizine HCl (ZYRTEC PO) Take by mouth daily.     co-enzyme Q-10 30 MG capsule Take 100 mg by mouth daily.     hydrochlorothiazide (HYDRODIURIL) 25 MG tablet Take 1 tablet (25 mg total) by mouth daily. 90 tablet 3   losartan (COZAAR) 50 MG tablet Take 1 tablet (50 mg total) by mouth daily. 90 tablet 3   rosuvastatin (CRESTOR) 10 MG tablet Take 1 tablet (10 mg total) by mouth daily. 90 tablet 3   acetaminophen (TYLENOL) 500 MG tablet Take 500 mg by mouth every 6 (six) hours as needed.     Aspirin-Acetaminophen-Caffeine (EXCEDRIN EXTRA STRENGTH PO) Take 1 tablet by mouth daily. Foe headaches     Current Facility-Administered Medications  Medication Dose Route Frequency Provider Last Rate Last Admin   0.9 %  sodium chloride infusion  500 mL Intravenous Continuous Linford Quintela, Carie Caddy, MD        Allergies as of 09/18/2022   (No Known Allergies)    Family History  Problem Relation Age of Onset   Hypertension Mother        54 in 2021   Hyperlipidemia Mother    Anxiety disorder Mother    COPD Father        o2 dependent. 82 in 2021. former smoker   Skin cancer Father    Healthy Brother        55 in 2021   Cancer Paternal Aunt        Abdominal   Colon cancer Paternal Uncle    Dementia Maternal Grandmother    Hearing loss Maternal Grandfather    Heart disease Maternal Grandfather        sudden heart attack 5   Cancer Paternal Grandmother        Abdominal Cancer   Heart disease Paternal Grandfather        aortic  aneurysm- smoker   Healthy Daughter    Healthy Son    Breast cancer Other        on multiple male family members   Colon polyps Neg Hx    Rectal cancer Neg Hx    Stomach cancer Neg Hx    Esophageal cancer Neg Hx     Social History   Socioeconomic History   Marital status: Married    Spouse name: Kim   Number of children: 3   Years of education: Not on file   Highest education level: Not on file  Occupational History   Not on file  Tobacco Use   Smoking status: Never   Smokeless tobacco: Never  Vaping Use   Vaping Use: Never used  Substance and Sexual Activity   Alcohol use: Yes    Alcohol/week: 1.0 - 2.0 standard drink of alcohol    Types: 1 - 2 Cans of beer per week    Comment: couple times a month   Drug use: No   Sexual activity: Yes    Comment: wife only- hystrectomy for birth control  Other Topics Concern   Not on file  Social History Narrative   Married 2017. 1 step son. 2 children in their 4s.       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      Hobbies: movies and reading, out to dinner   Social Determinants of Corporate investment banker Strain: Not on [employer] Insecurity: Not on file  Transportation Needs: Not on file  Physical Activity: Not on file  Stress: Not on file  Social Connections: Not on file  Intimate Partner Violence: Not on file    Physical Exam: Vital signs in last 24 hours:  101/75   Pulse 83   Temp 98.6 F (37 C) (Skin)   Ht  (1.803 m)   Wt 257 lb (116.6 kg)   SpO2 96%   BMI 35.84 kg/m  GEN: NAD EYE: Sclerae anicteric ENT: MMM CV: Non-tachycardic Pulm: CTA b/l GI: Soft, NT/ND NEURO:  Alert & Oriented x 3   Erick Blinks, MD [facility]  09/18/2022 2:14 PM

## 2022-09-18 NOTE — Op Note (Signed)
[facility] Patient Name: Richard French Procedure Date: 09/18/2022 2:14 PM MRN: [id number] Endoscopist: Beverley Fiedler , MD, [id number] Age: 57 Referring MD:  Date of Birth: December 25, 1965 Gender: Male Account #: [id number] Procedure:                Colonoscopy Indications:              High risk colon cancer surveillance: Personal                            history of sessile serrated colon polyp (less than                            10 mm in size) with no dysplasia and adenomas, Last                            colonoscopy: October 2019 Medicines:                Monitored Anesthesia Care Procedure:                Pre-Anesthesia Assessment:                           - Prior to the procedure, a History and Physical                            was performed, and patient medications and                            allergies were reviewed. The patient's tolerance of                            previous anesthesia was also reviewed. The risks                            and benefits of the procedure and the sedation                            options and risks were discussed with the patient.                            All questions were answered, and informed consent                            was obtained. Prior Anticoagulants: The patient has                            taken no anticoagulant or antiplatelet agents. ASA                            Grade Assessment: II - A patient with mild systemic                            disease. After reviewing the risks and benefits,  the patient was deemed in satisfactory condition to                            undergo the procedure.                           After obtaining informed consent, the colonoscope                            was passed under direct vision. Throughout the                            procedure, the patient's blood pressure, pulse, and                            oxygen saturations were monitored  continuously. The                            Olympus CF-HQ190L [id number]) Colonoscope was                            introduced through the anus and advanced to the                            cecum, identified by appendiceal orifice and                            ileocecal valve. The colonoscopy was performed                            without difficulty. The patient tolerated the                            procedure well. The quality of the bowel                            preparation was good. The ileocecal valve,                            appendiceal orifice, and rectum were photographed. Scope In: 2:34:42 PM Scope Out: 2:49:35 PM Scope Withdrawal Time: 0 hours 13 minutes 57 seconds  Total Procedure Duration: 0 hours 14 minutes 53 seconds  Findings:                 The digital rectal exam was normal.                           A 9 mm polyp was found in the ascending colon. The                            polyp was sessile. The polyp was removed with a                            cold snare. Resection and retrieval were complete.  A 4 mm polyp was found in the transverse colon. The                            polyp was sessile. The polyp was removed with a                            cold snare. Resection and retrieval were complete.                           A 6 mm polyp was found in the sigmoid colon. The                            polyp was semi-pedunculated. The polyp was removed                            with a cold snare. Resection and retrieval were                            complete.                           Multiple large-mouthed, medium-mouthed and                            small-mouthed diverticula were found in the sigmoid                            colon, descending colon and hepatic flexure.                           The retroflexed view of the distal rectum and anal                            verge was normal and showed no anal or rectal                             abnormalities. Complications:            No immediate complications. Estimated Blood Loss:     Estimated blood loss was minimal. Impression:               - One 9 mm polyp in the ascending colon, removed                            with a cold snare. Resected and retrieved.                           - One 4 mm polyp in the transverse colon, removed                            with a cold snare. Resected and retrieved.                           - One 6 mm polyp in  the sigmoid colon, removed with                            a cold snare. Resected and retrieved.                           - Moderate diverticulosis in the sigmoid colon, in                            the descending colon and at the hepatic flexure.                           - The distal rectum and anal verge are normal on                            retroflexion view. Recommendation:           - Patient has a contact number available for                            emergencies. The signs and symptoms of potential                            delayed complications were discussed with the                            patient. Return to normal activities tomorrow.                            Written discharge instructions were provided to the                            patient.                           - Resume previous diet.                           - Continue present medications.                           - Await pathology results.                           - Repeat colonoscopy is recommended for                            surveillance. The colonoscopy date will be                            determined after pathology results from today's                            exam become available for review. Beverley Fiedler, MD 09/18/2022 3:00:46 PM This report has been signed electronically.

## 2022-09-19 ENCOUNTER — Other Ambulatory Visit: Payer: Self-pay

## 2022-09-19 ENCOUNTER — Other Ambulatory Visit (HOSPITAL_BASED_OUTPATIENT_CLINIC_OR_DEPARTMENT_OTHER): Payer: Self-pay

## 2022-09-19 ENCOUNTER — Telehealth: Payer: Self-pay

## 2022-09-19 DIAGNOSIS — K2100 Gastro-esophageal reflux disease with esophagitis, without bleeding: Secondary | ICD-10-CM

## 2022-09-19 DIAGNOSIS — K209 Esophagitis, unspecified without bleeding: Secondary | ICD-10-CM

## 2022-09-19 MED ORDER — PANTOPRAZOLE SODIUM 40 MG PO TBEC
40.0000 mg | DELAYED_RELEASE_TABLET | Freq: Every day | ORAL | 3 refills | Status: DC
Start: 2022-09-19 — End: 2023-10-23
  Filled 2022-09-19 – 2022-12-13 (×2): qty 90, 90d supply, fill #0
  Filled 2023-03-15: qty 90, 90d supply, fill #1
  Filled 2023-06-28: qty 90, 90d supply, fill #2

## 2022-09-19 NOTE — Telephone Encounter (Signed)
Follow up call placed, no answer and no VM. 

## 2022-09-20 ENCOUNTER — Telehealth: Payer: Self-pay | Admitting: Internal Medicine

## 2022-09-20 NOTE — Telephone Encounter (Unsigned)
Looks like pt is at the facility having scan done this am.

## 2022-09-20 NOTE — Telephone Encounter (Signed)
Hello, pt's CT scan is still pending clinical review through [facility], I uploaded records.

## 2022-09-21 ENCOUNTER — Ambulatory Visit (HOSPITAL_BASED_OUTPATIENT_CLINIC_OR_DEPARTMENT_OTHER)
Admission: RE | Admit: 2022-09-21 | Discharge: 2022-09-21 | Disposition: A | Discharge status: Home / Self Care | Payer: Managed Care, Other (non HMO) | Source: Ambulatory Visit | Attending: Internal Medicine | Admitting: Internal Medicine

## 2022-09-21 DIAGNOSIS — R1010 Upper abdominal pain, unspecified: Secondary | ICD-10-CM | POA: Diagnosis present

## 2022-09-21 LAB — POCT I-STAT CREATININE: Creatinine, Ser: 1.2 mg/dL (ref 0.61–1.24)

## 2022-09-21 MED ORDER — IOHEXOL 300 MG/ML  SOLN
100.0000 mL | Freq: Once | INTRAMUSCULAR | Status: AC | PRN
  Administered 2022-09-21: 80 mL via INTRAVENOUS

## 2022-09-26 ENCOUNTER — Encounter: Payer: Self-pay | Admitting: Internal Medicine

## 2022-12-13 ENCOUNTER — Other Ambulatory Visit (HOSPITAL_BASED_OUTPATIENT_CLINIC_OR_DEPARTMENT_OTHER): Payer: Self-pay

## 2023-01-23 ENCOUNTER — Other Ambulatory Visit (HOSPITAL_BASED_OUTPATIENT_CLINIC_OR_DEPARTMENT_OTHER): Payer: Self-pay

## 2023-01-23 ENCOUNTER — Other Ambulatory Visit: Payer: Self-pay | Admitting: Family Medicine

## 2023-01-31 ENCOUNTER — Encounter: Payer: Self-pay | Admitting: Family Medicine

## 2023-02-01 ENCOUNTER — Other Ambulatory Visit (HOSPITAL_BASED_OUTPATIENT_CLINIC_OR_DEPARTMENT_OTHER): Payer: Self-pay

## 2023-02-01 ENCOUNTER — Other Ambulatory Visit: Payer: Self-pay

## 2023-02-01 MED ORDER — LOSARTAN POTASSIUM 50 MG PO TABS
50.0000 mg | ORAL_TABLET | Freq: Every day | ORAL | 0 refills | Status: DC
  Filled 2023-02-01 – 2023-05-28 (×2): qty 60, 60d supply, fill #0

## 2023-04-06 ENCOUNTER — Other Ambulatory Visit (HOSPITAL_BASED_OUTPATIENT_CLINIC_OR_DEPARTMENT_OTHER): Payer: Self-pay

## 2023-04-06 MED ORDER — FLULAVAL 0.5 ML IM SUSY
0.5000 mL | PREFILLED_SYRINGE | Freq: Once | INTRAMUSCULAR | 0 refills | Status: AC
  Filled 2023-04-06: qty 0.5, 1d supply, fill #0

## 2023-04-06 MED ORDER — COMIRNATY 30 MCG/0.3ML IM SUSY
0.3000 mL | PREFILLED_SYRINGE | Freq: Once | INTRAMUSCULAR | 0 refills | Status: AC
  Filled 2023-04-06: qty 0.3, 1d supply, fill #0

## 2023-05-28 ENCOUNTER — Other Ambulatory Visit: Payer: Self-pay

## 2023-05-28 ENCOUNTER — Other Ambulatory Visit (HOSPITAL_BASED_OUTPATIENT_CLINIC_OR_DEPARTMENT_OTHER): Payer: Self-pay

## 2023-05-28 ENCOUNTER — Other Ambulatory Visit: Payer: Self-pay | Admitting: Family Medicine

## 2023-05-29 ENCOUNTER — Other Ambulatory Visit (HOSPITAL_BASED_OUTPATIENT_CLINIC_OR_DEPARTMENT_OTHER): Payer: Self-pay

## 2023-05-29 MED ORDER — HYDROCHLOROTHIAZIDE 25 MG PO TABS
ORAL_TABLET | ORAL | 0 refills | Status: DC
  Filled 2023-05-29 – 2023-06-11 (×2): qty 30, 30d supply, fill #0

## 2023-05-31 ENCOUNTER — Other Ambulatory Visit (HOSPITAL_BASED_OUTPATIENT_CLINIC_OR_DEPARTMENT_OTHER): Payer: Self-pay

## 2023-06-11 ENCOUNTER — Other Ambulatory Visit (HOSPITAL_BASED_OUTPATIENT_CLINIC_OR_DEPARTMENT_OTHER): Payer: Self-pay

## 2023-06-28 ENCOUNTER — Other Ambulatory Visit: Payer: Self-pay | Admitting: Family Medicine

## 2023-06-28 ENCOUNTER — Other Ambulatory Visit: Payer: Self-pay

## 2023-07-05 ENCOUNTER — Encounter (HOSPITAL_BASED_OUTPATIENT_CLINIC_OR_DEPARTMENT_OTHER): Payer: Self-pay

## 2023-07-05 ENCOUNTER — Other Ambulatory Visit (HOSPITAL_BASED_OUTPATIENT_CLINIC_OR_DEPARTMENT_OTHER): Payer: Self-pay

## 2023-08-15 ENCOUNTER — Other Ambulatory Visit (HOSPITAL_BASED_OUTPATIENT_CLINIC_OR_DEPARTMENT_OTHER): Payer: Self-pay

## 2023-08-24 ENCOUNTER — Other Ambulatory Visit (HOSPITAL_BASED_OUTPATIENT_CLINIC_OR_DEPARTMENT_OTHER): Payer: Self-pay

## 2023-08-27 ENCOUNTER — Other Ambulatory Visit (HOSPITAL_BASED_OUTPATIENT_CLINIC_OR_DEPARTMENT_OTHER): Payer: Self-pay

## 2023-08-27 ENCOUNTER — Ambulatory Visit (INDEPENDENT_AMBULATORY_CARE_PROVIDER_SITE_OTHER): Admitting: Family Medicine

## 2023-08-27 ENCOUNTER — Encounter: Payer: Self-pay | Admitting: Family Medicine

## 2023-08-27 VITALS — BP 130/84 | HR 87 | Temp 98.2°F | Ht 71.0 in | Wt 257.2 lb

## 2023-08-27 DIAGNOSIS — Z125 Encounter for screening for malignant neoplasm of prostate: Secondary | ICD-10-CM | POA: Diagnosis not present

## 2023-08-27 DIAGNOSIS — Z131 Encounter for screening for diabetes mellitus: Secondary | ICD-10-CM

## 2023-08-27 DIAGNOSIS — E785 Hyperlipidemia, unspecified: Secondary | ICD-10-CM

## 2023-08-27 DIAGNOSIS — I10 Essential (primary) hypertension: Secondary | ICD-10-CM | POA: Diagnosis not present

## 2023-08-27 DIAGNOSIS — E669 Obesity, unspecified: Secondary | ICD-10-CM

## 2023-08-27 MED ORDER — LOSARTAN POTASSIUM 50 MG PO TABS
50.0000 mg | ORAL_TABLET | Freq: Every day | ORAL | 3 refills | Status: DC
  Filled 2023-08-27: qty 90, 90d supply, fill #0
  Filled 2023-10-23 – 2023-11-15 (×2): qty 90, 90d supply, fill #1
  Filled 2024-02-11: qty 90, 90d supply, fill #2
  Filled 2024-05-20: qty 90, 90d supply, fill #3

## 2023-08-27 MED ORDER — HYDROCHLOROTHIAZIDE 25 MG PO TABS
25.0000 mg | ORAL_TABLET | Freq: Every day | ORAL | 3 refills | Status: DC
  Filled 2023-08-27: qty 90, 90d supply, fill #0
  Filled 2023-10-23 – 2023-11-15 (×3): qty 90, 90d supply, fill #1
  Filled 2024-02-11: qty 90, 90d supply, fill #2
  Filled 2024-05-20: qty 90, 90d supply, fill #3

## 2023-08-27 MED ORDER — ROSUVASTATIN CALCIUM 10 MG PO TABS
10.0000 mg | ORAL_TABLET | Freq: Every day | ORAL | 3 refills | Status: DC
  Filled 2023-08-27: qty 90, 90d supply, fill #0
  Filled 2023-10-23 – 2023-11-15 (×2): qty 90, 90d supply, fill #1
  Filled 2024-02-11: qty 90, 90d supply, fill #2
  Filled 2024-05-20: qty 90, 90d supply, fill #3

## 2023-08-27 NOTE — Patient Instructions (Addendum)
 Exercise prescription: run or swim at least 20 minutes 5 days a week  Try to increase water and cut out sugary drinks- I like your idea of no coke- low hanging fruit!   For help with eating healthier- consider [employer]uka app  I suggest myfitnesspal Use 0.5 pounds per week weight loss goal (or up to 1 pounds if needed) Set a reasonable goal such as 5-10 lbs and can reset goal once you reach the goal Do not connect your step counter to this- watch or phone Update me in 2-3 months with how you are doing  Goal at least 10 lbs down in 6 months  Mineral oil for ear full of wax OR can buy debrox OTC (available over the counter without a prescription)  Purchase mineral oil from laxative aisle Lay down on your side with ear that is bothering you facing up Use 3-4 drops with a dropper and place in ear for 30 seconds Place cotton swab outside of ear Turn to other side and allow this to drain Repeat 3-4 x a day Return to see [place] if not improving within a few days  Team please give him exercises for bicipital tendonitis  I want you to do the exercise 3x a week for a month then once a week for another month. Stop any exercise that causes more than 1-2/10 pain increase. If not doing better within 1-2 months let us refer you to sports medicine or orthopedic    Recommended follow up: Return in about 6 months (around 02/26/2024) for physical or sooner if needed.Schedule b4 you leave.

## 2023-08-27 NOTE — Progress Notes (Signed)
 Phone [phone] In person visit   Subjective:   Richard French is a 58 y.o. year old very pleasant male patient who presents for/with See problem oriented charting Chief Complaint  Patient presents with   Hypertension    Pt needed refills on Losartan but had not been seen since 2023   Ear Fullness   left shoulder pain    Pt c/o left shoulder pain that has been going on for about 4 weeks.    Past Medical History-  Patient Active Problem List   Diagnosis Date Noted   Diastasis recti 04/19/2022    Priority: Medium    Aortic atherosclerosis (HCC) 04/07/2022    Priority: Medium    Hyperlipidemia 04/27/2020    Priority: Medium    Stiffness of joints of both hands 02/19/2019    Priority: Medium    GERD (gastroesophageal reflux disease) 11/05/2017    Priority: Medium    Essential hypertension 11/05/2017    Priority: Medium    Carpal tunnel syndrome of right wrist 02/19/2019    Priority: 1.   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   Edema 11/05/2017    Priority: 1.    Medications- reviewed and updated Current Outpatient Medications  Medication Sig Dispense Refill   Cetirizine HCl (ZYRTEC PO) Take by mouth daily.     co-enzyme Q-10 30 MG capsule Take 100 mg by mouth daily.     pantoprazole (PROTONIX) 40 MG tablet Take 1 tablet (40 mg total) by mouth daily. 90 tablet 3   Aspirin-Acetaminophen-Caffeine (EXCEDRIN EXTRA STRENGTH PO) Take 1 tablet by mouth daily. Foe headaches     hydrochlorothiazide (HYDRODIURIL) 25 MG tablet Take 1 tablet (25 mg total) by mouth daily. 90 tablet 3   losartan (COZAAR) 50 MG tablet Take 1 tablet (50 mg total) by mouth daily. 90 tablet 3   rosuvastatin (CRESTOR) 10 MG tablet Take 1 tablet (10 mg total) by mouth daily. 90 tablet 3   No current facility-administered medications for this visit.     Objective:  BP 130/84   Pulse 87   Temp 98.2 F (36.8 C)   Ht 5'  11" (1.803 m)   Wt 257 lb 3.2 oz (116.7 kg)   SpO2 98%   BMI 35.87 kg/m  Gen: NAD, resting comfortably Tympanic membrane obstructed bilaterally by cerumen CV: RRR no murmurs rubs or gallops Lungs: CTAB no crackles, wheeze, rhonchi Ext: no edema Skin: warm, dry Musculoskeletal: pain with arm overhead but good range of motion- reports pain with throwing motion. Very tender over bicipital groove    Assessment and Plan   #hypertension S: medication: Hydrochlorothiazide 25 mg, losartan 50 mg Home readings #s: feels like blood pressure is up without medicine. Also feels some fluid retention BP Readings from Last 3 Encounters:  08/27/23 130/84  09/18/22 120/72  07/26/22 124/78  A/P: well controlled on medications- so will restart- continue current medications  -plans to try to increase exercise  #hyperlipidemia-CT coronary morphology with Dr. 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 #Aortic atherosclerosis S: Medication:Rosuvastatin 10 mg daily- missing some doses (quite a few including off for 2 months) Lab Results  Component Value Date   CHOL 127 04/19/2022   HDL 49.00 04/19/2022   LDLCALC 64 04/19/2022   TRIG 70.0 04/19/2022   CHOLHDL 3 04/19/2022  A/P: lipids have  been at goal but has missed some doses- we want to see how #s are looking- check today and he is going to restart- we can recheck direct LDL at next visit  #Family history of prostate cancer-in maternal uncle-he wants to scan annually- check today  Lab Results  Component Value Date   PSA 0.65 04/19/2022   PSA 1.23 04/27/2020   PSA 1.59 11/05/2017   #Prior daily headache(s)- was on regular Excedrin but took break and now down to twice a week - over a week since last dose  #Ear congestion- plugged particularly after shower  -cerumen impaction bilaterally - wants to trial mineral oil or debrox as first tep  # left shoulder  pain S:anterior shoulder- if arm out to side and tries to pull arm back in/flex- gets pain . Also with lifting arm overhead or trying to throw (though is right handed so doesn't use this arm to throw often) A/P: appears to be "Team please give him exercises for bicipital tendonitis  I want you to do the exercise 3x a week for a month then once a week for another month. Stop any exercise that causes more than 1-2/10 pain increase. If not doing better within 1-2 months let us refer you to sports medicine or orthopedic "   Recommended follow up: Return in about 6 months (around 02/26/2024) for physical or sooner if needed.Schedule b4 you leave.  Lab/Order associations:   ICD-10-CM   1. Essential hypertension  I10 Comprehensive metabolic panel with GFR    CBC with Differential/Platelet    Lipid panel    2. Hyperlipidemia, unspecified hyperlipidemia type  E78.5 Comprehensive metabolic panel with GFR    CBC with Differential/Platelet    Lipid panel    3. Screening for prostate cancer  Z12.5 PSA    4. Screening for diabetes mellitus  Z13.1 Hemoglobin A1c    5. Obesity (BMI 30-39.9)  E66.9 Hemoglobin A1c      Meds ordered this encounter  Medications   hydrochlorothiazide (HYDRODIURIL) 25 MG tablet    Sig: Take 1 tablet (25 mg total) by mouth daily.    Dispense:  90 tablet    Refill:  3   rosuvastatin (CRESTOR) 10 MG tablet    Sig: Take 1 tablet (10 mg total) by mouth daily.    Dispense:  90 tablet    Refill:  3   losartan (COZAAR) 50 MG tablet    Sig: Take 1 tablet (50 mg total) by mouth daily.    Dispense:  90 tablet    Refill:  3    Return precautions advised.  Tana Conch, MD

## 2023-08-27 NOTE — Addendum Note (Signed)
 Addended by: Gwenette Greet on: 08/27/2023 04:20 PM   Modules accepted: Orders

## 2023-08-27 NOTE — Addendum Note (Signed)
 Addended by: Lorn Junes on: 08/27/2023 04:55 PM   Modules accepted: Orders

## 2023-08-28 ENCOUNTER — Encounter: Payer: Self-pay | Admitting: Family Medicine

## 2023-08-28 LAB — COMPREHENSIVE METABOLIC PANEL WITH GFR
AG Ratio: 1.7 (calc) (ref 1.0–2.5)
ALT: 20 U/L (ref 9–46)
AST: 16 U/L (ref 10–35)
Albumin: 4.3 g/dL (ref 3.6–5.1)
Alkaline phosphatase (APISO): 55 U/L (ref 35–144)
BUN: 18 mg/dL (ref 7–25)
CO2: 21 mmol/L (ref 20–32)
Calcium: 9.2 mg/dL (ref 8.6–10.3)
Chloride: 108 mmol/L (ref 98–110)
Creat: 0.89 mg/dL (ref 0.70–1.30)
Globulin: 2.6 g/dL (ref 1.9–3.7)
Glucose, Bld: 79 mg/dL (ref 65–99)
Potassium: 3.9 mmol/L (ref 3.5–5.3)
Sodium: 140 mmol/L (ref 135–146)
Total Bilirubin: 0.4 mg/dL (ref 0.2–1.2)
Total Protein: 6.9 g/dL (ref 6.1–8.1)
eGFR: 101 mL/min/{1.73_m2} (ref >=60)

## 2023-08-28 LAB — CBC WITH DIFFERENTIAL/PLATELET
Absolute Lymphocytes: 2507 {cells}/uL (ref 850–3900)
Absolute Monocytes: 556 {cells}/uL (ref 200–950)
Basophils Absolute: 76 {cells}/uL (ref 0–200)
Basophils Relative: 0.7 %
Eosinophils Absolute: 76 {cells}/uL (ref 15–500)
Eosinophils Relative: 0.7 %
HCT: 47.5 % (ref 38.5–50.0)
Hemoglobin: 16.1 g/dL (ref 13.2–17.1)
MCH: 29.5 pg (ref 27.0–33.0)
MCHC: 33.9 g/dL (ref 32.0–36.0)
MCV: 87.2 fL (ref 80.0–100.0)
MPV: 9.2 fL (ref 7.5–12.5)
Monocytes Relative: 5.1 %
Neutro Abs: 7685 {cells}/uL (ref 1500–7800)
Neutrophils Relative %: 70.5 %
Platelets: 324 10*3/uL (ref 140–400)
RBC: 5.45 10*6/uL (ref 4.20–5.80)
RDW: 13.6 % (ref 11.0–15.0)
Total Lymphocyte: 23 %
WBC: 10.9 10*3/uL — ABNORMAL HIGH (ref 3.8–10.8)

## 2023-08-28 LAB — LIPID PANEL
Cholesterol: 209 mg/dL — ABNORMAL HIGH (ref <200)
HDL: 47 mg/dL (ref >=40)
LDL Cholesterol (Calc): 130 mg/dL — ABNORMAL HIGH
Non-HDL Cholesterol (Calc): 162 mg/dL — ABNORMAL HIGH (ref <130)
Total CHOL/HDL Ratio: 4.4 (calc) (ref <5.0)
Triglycerides: 187 mg/dL — ABNORMAL HIGH (ref <150)

## 2023-08-28 LAB — PSA: PSA: 0.96 ng/mL (ref <=4.00)

## 2023-08-28 LAB — HEMOGLOBIN A1C
Hgb A1c MFr Bld: 5.7 %{Hb} — ABNORMAL HIGH (ref <5.7)
Mean Plasma Glucose: 117 mg/dL
eAG (mmol/L): 6.5 mmol/L

## 2023-08-30 ENCOUNTER — Ambulatory Visit: Payer: Self-pay

## 2023-08-30 ENCOUNTER — Ambulatory Visit (INDEPENDENT_AMBULATORY_CARE_PROVIDER_SITE_OTHER): Admitting: Family

## 2023-08-30 VITALS — BP 147/89 | HR 88 | Temp 98.4°F | Ht 71.0 in | Wt 254.1 lb

## 2023-08-30 DIAGNOSIS — H6123 Impacted cerumen, bilateral: Secondary | ICD-10-CM | POA: Diagnosis not present

## 2023-08-30 DIAGNOSIS — H9221 Otorrhagia, right ear: Secondary | ICD-10-CM

## 2023-08-30 NOTE — Telephone Encounter (Signed)
  Chief Complaint: right ear pain Symptoms: decreased hearing in right ear, bilateral ear congestion/fullness Frequency: pain since yesterday, decreased hearing and fullness since Saturday Pertinent Negatives: Patient denies use of Q tips or any thin objects going into ears, fever Disposition: [] ED /[] Urgent Care (no appt availability in office) / [x] Appointment(In office/virtual)/ []  [facility]/ [] Home Care/ [] Refused Recommended Disposition /[] [facility]/ []  Follow-up with PCP Additional Notes: Patient states he had an appointment on Monday and was complaining his right ear was clogged and there was a build up of wax in both ears. He tried to do an OTC kit over the past few days and states the pain was excruciating yesterday after using the kit. He states Dr Durene Cal told him if the OTC treatment did not work he could come in to have the ear wax cleaned out. Called CAL to confirm if this requires a nurse visit or acute visit. Staff states patient would need an acute office visit. PCP not available today, patient scheduled with NP Judeth Cornfield this afternoon.  Copied from CRM [id number]. Topic: Clinical - Red Word Triage >> Aug 30, 2023 10:39 AM Fonda Kinder J wrote: Red Word that prompted transfer to Nurse Triage: Extreme pain in clogged ear. Pt used a cleaning kit to try and clean ear and pain has since become excruciating Reason for Disposition  [1] Ear pain after ear syringing (i.e., squirting liquid into ear canal to remove wax) AND [2] severe  Answer Assessment - Initial Assessment Questions 1. LOCATION: "Which ear is involved?"      Right ear.  2. SYMPTOMS: "What are the main symptoms?" (e.g., fullness, decreased hearing, itching, discomfort)     Started with fullness/congestion in ears and has developed to pain.  3. ONSET: "When did the  pain  start?"     Yesterday morning.  4. PAIN: "Is there any earache?" "How bad is it?"  (Scale 1-10; or mild, moderate, severe)      2/10 with Excedrin. He states last night it was a 7/10 and kept him from falling asleep.  5. OBJECTS: "Do you use cotton swabs (Q-tips) in your ear?" "Have you put anything else in your ear?"     Denies using Q tips or placing any objects in his ear. He did use the OTC kit for earwax build up and the syringe that comes with it.  6. EARWAX HISTORY: "Have you had problems with earwax before?" If Yes, ask: "What did you do the last time?"     Denies.  Protocols used: Earwax-A-AH

## 2023-08-30 NOTE — Telephone Encounter (Signed)
 Noted.

## 2023-08-30 NOTE — Progress Notes (Signed)
 Patient ID: Richard French, male    DOB: 1965-09-04, 58 y.o.   MRN: [id number]  Chief Complaint  Patient presents with   Ear Pain    Pt c/o bilateral ear hearing loss, pain and fullness. Has tried Excedrin which did help. Present since yesterday, tried debrox which made ears hurt worse.   Discussed the use of AI scribe software for clinical note transcription with the patient, who gave verbal consent to proceed.  History of Present Illness The patient presents with bilateral ear pain, described as a sensation of 'muffled hearing.' The discomfort began a few months ago, initially presenting as a toothache. Dental evaluation, including x-rays, did not reveal any abnormalities. The pain has since spread to the eye socket and ear, and is described as 'excruciating' at times. The patient reports that the pain is relieved by Excedrin. The patient also reports a history of sinus issues, with pain localized behind the eye and in the teeth. He denies any sinus pressure or green mucus. He takes Zyrtec daily for allergies. The patient also mentions a problem with his arm, for which he has been advised to take ibuprofen and perform home physical therapy.  Assessment & Plan Cerumen impaction with ear canal trauma - Significant cerumen causing bleeding in the right ear canal. Tympanic membrane intact. Bleeding due to cerumen adherence and skin trauma. Verbal consent received to perform bilateral ear lavage via Hydrogen peroxide/water mix solution. Curette used to remove visible cerumen. Pt tolerated well, complete evacuation of all cerumen obtained. Moderate amount of blood in right ear canal noted and mild bleeding & erythema in left canal.  - Advised to leave ears alone, no Debrox, no q-tips, irrigate ears in shower with warm water, allow to fill ear canal for one minute then flush with water in shower, shake out water after shower, bleeding may be noticeable for another 24 hours. - Advise acetaminophen for  analgesia. - Recommend ibuprofen, up to three tablets three times a day, for pain and inflammation. - Schedule 1 week f/u for ear recheck  Sinus-related facial pain - Pain in right eye socket, teeth, and ear due to sinus congestion. No nasal discharge, headache or tooth pain. - Recommend over-the-counter intranasal corticosteroids such as fluticasone or triamcinolone, 1 squirt each nostril twice a day for 3 days, then daily until symptoms improved. - Continue cetirizine daily. - Ibuprofen as instructed above. - Advise to report any purulent nasal discharge, foul taste or smell to discharge, or worsening pain. - Return in 1 week above for ear recheck if sinus pain/congestion no better will start antibiotic.   Subjective:    Outpatient Medications Prior to Visit  Medication Sig Dispense Refill   Aspirin-Acetaminophen-Caffeine (EXCEDRIN EXTRA STRENGTH PO) Take 1 tablet by mouth daily. Foe headaches     Cetirizine HCl (ZYRTEC PO) Take by mouth daily.     co-enzyme Q-10 30 MG capsule Take 100 mg by mouth daily.     hydrochlorothiazide (HYDRODIURIL) 25 MG tablet Take 1 tablet (25 mg total) by mouth daily. 90 tablet 3   losartan (COZAAR) 50 MG tablet Take 1 tablet (50 mg total) by mouth daily. 90 tablet 3   pantoprazole (PROTONIX) 40 MG tablet Take 1 tablet (40 mg total) by mouth daily. 90 tablet 3   rosuvastatin (CRESTOR) 10 MG tablet Take 1 tablet (10 mg total) by mouth daily. 90 tablet 3   No facility-administered medications prior to visit.   Past Medical History:  Diagnosis Date   Arthritis  hand   Basal cell carcinoma    back and buttocks   Blood pressure elevated without history of HTN    tx w/hctz   Cancer (HCC)    CHF (congestive heart failure) (HCC)    Diverticulosis    GERD (gastroesophageal reflux disease)    Headache    otc med PRN   History of chicken pox    Had it twice   Internal hemorrhoids    MVP (mitral valve prolapse)    not heard by cardiology- only heard  with military physical in 1993   Seasonal allergies    Past Surgical History:  Procedure Laterality Date   BASAL CELL CARCINOMA EXCISION N/A 2017   From back, lower buttocks   COLONOSCOPY     EYE SURGERY Bilateral    Lasik   WISDOM TOOTH EXTRACTION     No Known Allergies    Objective:    Physical Exam Vitals and nursing note reviewed.  Constitutional:      General: He is not in acute distress.    Appearance: Normal appearance.  HENT:     Head: Normocephalic.     Right Ear: Drainage (lot of bloody drainage after cerumen disimpaction, see small light reflected off TM, but unable to completely visualize) present. Tympanic membrane is not retracted or bulging.     Left Ear: There is impacted cerumen.     Nose:     Right Sinus: Maxillary sinus tenderness and frontal sinus tenderness present.     Left Sinus: No maxillary sinus tenderness or frontal sinus tenderness.  Eyes:     Extraocular Movements: Extraocular movements intact.     Conjunctiva/sclera: Conjunctivae normal.  Cardiovascular:     Rate and Rhythm: Normal rate and regular rhythm.  Pulmonary:     Effort: Pulmonary effort is normal.     Breath sounds: Normal breath sounds.  Musculoskeletal:        General: Normal range of motion.     Cervical back: Normal range of motion.  Lymphadenopathy:     Head:     Right side of head: No submandibular, tonsillar, preauricular, posterior auricular or occipital adenopathy.     Left side of head: No submandibular, tonsillar, preauricular, posterior auricular or occipital adenopathy.     Cervical: No cervical adenopathy.  Skin:    General: Skin is warm and dry.  Neurological:     Mental Status: He is alert and oriented to person, place, and time.  Psychiatric:        Mood and Affect: Mood normal.    BP (!) 147/89 (BP Location: Left Arm, Patient Position: Sitting, Cuff Size: Large)   Pulse 88   Temp 98.4 F (36.9 C) (Temporal)   Ht 5\' 11"  (1.803 m)   Wt 254 lb 2 oz (115.3  kg)   SpO2 98%   BMI 35.44 kg/m  Wt Readings from Last 3 Encounters:  08/30/23 254 lb 2 oz (115.3 kg)  08/27/23 257 lb 3.2 oz (116.7 kg)  09/18/22 257 lb (116.6 kg)       Dulce Sellar, NP

## 2023-09-06 ENCOUNTER — Encounter: Payer: Self-pay | Admitting: Family Medicine

## 2023-09-06 ENCOUNTER — Ambulatory Visit (INDEPENDENT_AMBULATORY_CARE_PROVIDER_SITE_OTHER): Admitting: Family Medicine

## 2023-09-06 VITALS — BP 122/80 | HR 93 | Temp 98.0°F | Ht 71.0 in | Wt 253.2 lb

## 2023-09-06 DIAGNOSIS — E785 Hyperlipidemia, unspecified: Secondary | ICD-10-CM | POA: Diagnosis not present

## 2023-09-06 DIAGNOSIS — I10 Essential (primary) hypertension: Secondary | ICD-10-CM

## 2023-09-06 DIAGNOSIS — H6123 Impacted cerumen, bilateral: Secondary | ICD-10-CM | POA: Diagnosis not present

## 2023-09-06 NOTE — Patient Instructions (Addendum)
 The left ear wax looks hardened and very close to the membrane- lets trial mineral oil 3-4 drops twice daily - similar to how you used debrox The right ear canal looks close to membrane but more soft. Lets leave the right alone There is irritation in both ear canals but particularly on bottom right I am going to refer to ENT- call them if you don't hear within a week  Recommended follow up: Return for as needed for new, worsening, persistent symptoms.j

## 2023-09-06 NOTE — Progress Notes (Signed)
 Phone [phone] In person visit   Subjective:   Richard French is a 58 y.o. year old very pleasant male patient who presents for/with See problem oriented charting Chief Complaint  Patient presents with   Follow-up    Pt here for 1 week follow up for impacted cerumen. Pt states no more pain in ear, woke up to blood on pillow from right ear.    Past Medical History-  Patient Active Problem List   Diagnosis Date Noted   Diastasis recti 04/19/2022    Priority: Medium    Aortic atherosclerosis (HCC) 04/07/2022    Priority: Medium    Hyperlipidemia 04/27/2020    Priority: Medium    Stiffness of joints of both hands 02/19/2019    Priority: Medium    GERD (gastroesophageal reflux disease) 11/05/2017    Priority: Medium    Essential hypertension 11/05/2017    Priority: Medium    Carpal tunnel syndrome of right wrist 02/19/2019    Priority: 1.   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   Edema 11/05/2017    Priority: 1.    Medications- reviewed and updated Current Outpatient Medications  Medication Sig Dispense Refill   Aspirin-Acetaminophen-Caffeine (EXCEDRIN EXTRA STRENGTH PO) Take 1 tablet by mouth daily. Foe headaches     Cetirizine HCl (ZYRTEC PO) Take by mouth daily.     co-enzyme Q-10 30 MG capsule Take 100 mg by mouth daily.     hydrochlorothiazide (HYDRODIURIL) 25 MG tablet Take 1 tablet (25 mg total) by mouth daily. 90 tablet 3   losartan (COZAAR) 50 MG tablet Take 1 tablet (50 mg total) by mouth daily. 90 tablet 3   pantoprazole (PROTONIX) 40 MG tablet Take 1 tablet (40 mg total) by mouth daily. 90 tablet 3   rosuvastatin (CRESTOR) 10 MG tablet Take 1 tablet (10 mg total) by mouth daily. 90 tablet 3   No current facility-administered medications for this visit.     Objective:  BP 122/80   Pulse 93   Temp 98 F (36.7 C)   Ht 5\' 11"  (1.803 m)   Wt 253 lb 3.2 oz (114.9 kg)   SpO2  98%   BMI 35.31 kg/m  Gen: NAD, resting comfortably -Hardly cerumen near left tympanic membrane and unable to fully visualize - Soft cerumen impaction near right tympanic membrane-ear canal with mild to moderate erythema on the bottom portion and some dried blood CV: RRR no murmurs rubs or gallops Lungs: CTAB no crackles, wheeze, rhonchi  Ext: no edema Skin: warm, dry     Assessment and Plan   # Cerumen impaction bilateral S: Patient with significant bilateral impaction last week and wanted to trial mineral oil or Debrox-he reports tried debrox- 2 treatments tues, wed - by wed or Thursday had worsening pain. Focused on right ear to make sure tolerated- wife looked in ear and appeared red and irritated with some blood.  Came in on 08/30/23 and had irrigation both ears- still felt stopped up- tylenol/ibuprofen.  -went to sleep on right side- Did have blood on his pillow from right ear that next morning -Congestion/pain feeling has improved.   -did have hearing loss but steadily improving now -has not used questions tips =prior sinus headaches better with allergy treatmnet A/P: Ongoing bilateral cerumen impaction.  Patient with worsening pain and irritation after trial of Debrox-treatment itself may have been an irritant to him in  the right ear canal at the bottom does appear inflamed.  Hardened cerumen near left tympanic membrane and soft cerumen impaction near right tympanic membrane-I am very hesitant to retrial irrigation and too close to the membrane to trial curetting plus I am concerned about irritating ear canal particular on the right -Refer to ENT within [facility] - Trial mineral oil in the left ear only due to hardened appearance of wax and avoid Debrox in case he was having a reaction to the treatment itself -New or worsening symptoms he will let us know -Thankful prior pain issues have resolved  #hypertension S: medication: Hydrochlorothiazide 25 mg, losartan 50 mg BP Readings from  Last 3 Encounters:  09/06/23 122/80  08/30/23 (!) 147/89  08/27/23 130/84  A/P: well controlled continue current medications - blood pressure was up previously with ear pain but has come back down todya  #hyperlipidemia-CT coronary morphology with Dr. 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  Recommended follow up: Return for as needed for new, worsening, persistent symptoms. Future Appointments  Date Time Provider Department Center  02/28/2024  3:20 PM Shelva Majestic, MD [facility]    Lab/Order associations:   ICD-10-CM   1. Bilateral impacted cerumen  H61.23 Ambulatory referral to ENT    2. Essential hypertension  I10     3. Hyperlipidemia, unspecified hyperlipidemia type  E78.5       No orders of the defined types were placed in this encounter.   Return precautions advised.  Tana Conch, MD

## 2023-09-19 ENCOUNTER — Ambulatory Visit (INDEPENDENT_AMBULATORY_CARE_PROVIDER_SITE_OTHER): Admitting: Physician Assistant

## 2023-09-19 DIAGNOSIS — H6123 Impacted cerumen, bilateral: Secondary | ICD-10-CM | POA: Diagnosis not present

## 2023-09-19 NOTE — Progress Notes (Signed)
 Dear Dr. Arlene Ben, Here is my assessment for our mutual patient, Richard French. Thank you for allowing me the opportunity to care for your patient. Please do not hesitate to contact me should you have any other questions. Sincerely, Belma Boxer PA-C  Otolaryngology Clinic Note Referring provider: Dr. Arlene Ben HPI:  Richard French is a 58 y.o. male kindly referred by Dr. Arlene Ben   This is a 58 year old gentleman seen in our office for evaluation of cerumen impaction.  The patient notes that for the last several months he has felt his hearing has been muffled and his ears feel stopped up.Richard French  He followed up with his primary care provider who noticed bilateral cerumen impaction.  They attempted irrigation of the cerumen, this was unsuccessful, they also attempted using a curette, again this was unsuccessful.  He attempted using over-the-counter earwax drops which irritated his ear so he stopped using them.  Today he notes that his ears do not feel significantly painful, denies any drainage, no fever.  He denies any dizziness.  He denies any hearing issues prior to the last several months.  No history of recurrent ear infections or problems with his ear prior    Independent Review of Additional Tests or Records:  Primary care office visit note on 08/30/2023, primary care office visit on 09/19/2023   PMH/Meds/All/SocHx/FamHx/ROS:   Past Medical History:  Diagnosis Date   Arthritis    hand   Basal cell carcinoma    back and buttocks   Blood pressure elevated without history of HTN    tx w/hctz   Cancer (HCC)    CHF (congestive heart failure) (HCC)    Diverticulosis    GERD (gastroesophageal reflux disease)    Headache    otc med PRN   History of chicken pox    Had it twice   Internal hemorrhoids    MVP (mitral valve prolapse)    not heard by cardiology- only heard with military physical in 1993   Seasonal allergies      Past Surgical History:  Procedure Laterality Date   BASAL CELL  CARCINOMA EXCISION N/A 2017   From back, lower buttocks   COLONOSCOPY     EYE SURGERY Bilateral    Lasik   WISDOM TOOTH EXTRACTION      Family History  Problem Relation Age of Onset   Hypertension Mother        94 in 2021   Hyperlipidemia Mother    Anxiety disorder Mother    COPD Father        o2 dependent. 82 in 2021. former smoker   Skin cancer Father    Healthy Brother        60 in 2021   Cancer Paternal Aunt        Abdominal   Colon cancer Paternal Uncle    Dementia Maternal Grandmother    Hearing loss Maternal Grandfather    Heart disease Maternal Grandfather        sudden heart attack 72   Cancer Paternal Grandmother        Abdominal Cancer   Heart disease Paternal Grandfather        aortic aneurysm- smoker   Healthy Daughter    Healthy Son    Breast cancer Other        on multiple male family members   Colon polyps Neg Hx    Rectal cancer Neg Hx    Stomach cancer Neg Hx    Esophageal cancer Neg Hx  Social Connections: Unknown (08/30/2023)   Social Connection and Isolation Panel [NHANES]    Frequency of Communication with Friends and Family: Patient declined    Frequency of Social Gatherings with Friends and Family: Patient declined    Attends Religious Services: Patient declined    Database administrator or Organizations: Patient declined    Attends Engineer, structural: Not on file    Marital Status: Married      Current Outpatient Medications:    Aspirin-Acetaminophen-Caffeine (EXCEDRIN EXTRA STRENGTH PO), Take 1 tablet by mouth daily. Foe headaches, Disp: , Rfl:    Cetirizine HCl (ZYRTEC PO), Take by mouth daily., Disp: , Rfl:    co-enzyme Q-10 30 MG capsule, Take 100 mg by mouth daily., Disp: , Rfl:    hydrochlorothiazide  (HYDRODIURIL ) 25 MG tablet, Take 1 tablet (25 mg total) by mouth daily., Disp: 90 tablet, Rfl: 3   losartan  (COZAAR ) 50 MG tablet, Take 1 tablet (50 mg total) by mouth daily., Disp: 90 tablet, Rfl: 3   pantoprazole   (PROTONIX ) 40 MG tablet, Take 1 tablet (40 mg total) by mouth daily., Disp: 90 tablet, Rfl: 3   rosuvastatin  (CRESTOR ) 10 MG tablet, Take 1 tablet (10 mg total) by mouth daily., Disp: 90 tablet, Rfl: 3   Physical Exam:   There were no vitals taken for this visit.  Pertinent Findings  CN II-XII intact  Bilateral EAC with cerumen impaction Weber 512: equal Rinne 512: AC > BC b/l  Anterior rhinoscopy: Septum midline; bilateral inferior turbinates with no hypertrophy No lesions of oral cavity/oropharynx; dentition in normal limits No obviously palpable neck masses/lymphadenopathy/thyromegaly No respiratory distress or stridor  Seprately Identifiable Procedures:  Procedure: Bilateral ear microscopy and cerumen removal using microscope (CPT [phone]) - Mod 50 Pre-procedure diagnosis: bilateral cerumen impaction external auditory canals Post-procedure diagnosis: same Indication: bilateral cerumen impaction; given patient's otologic complaints and history as well as for improved and comprehensive examination of external ear and tympanic membrane, bilateral otologic examination using microscope was performed and impacted cerumen removed  Procedure: Patient was placed semi-recumbent. Both ear canals were examined using the microscope with findings above. Cerumen removed from bilateral external auditory canals using suction and currette with improvement in EAC examination and patency. Left: EAC was patent. TM was intact . Middle ear was aerated. Drainage: none Right: EAC was patent. TM was intact . Middle ear was aerated . Drainage: none Patient tolerated the procedure well.   Impression & Plans:  Richard French is a 58 y.o. male with the following   Cerumen impaction-  The patient presented today with cerumen impaction.  This was removed without difficulty.  I see no signs of infection.  The patient will reach out to the office if she develops any new or worsening signs or symptoms.  She does not  feel she has any significant change to her baseline hearing and did not elect for audiology evaluation today.    - f/u prn   Thank you for allowing me the opportunity to care for your patient. Please do not hesitate to contact me should you have any other questions.  Sincerely, Belma Boxer PA-C [facility] Phone: [phone] Fax: [phone]  09/19/2023, 1:05 PM

## 2023-09-25 ENCOUNTER — Encounter (INDEPENDENT_AMBULATORY_CARE_PROVIDER_SITE_OTHER): Payer: Self-pay | Admitting: Physician Assistant

## 2023-10-23 ENCOUNTER — Other Ambulatory Visit (HOSPITAL_COMMUNITY): Payer: Self-pay

## 2023-10-23 ENCOUNTER — Other Ambulatory Visit: Payer: Self-pay | Admitting: Internal Medicine

## 2023-10-23 ENCOUNTER — Other Ambulatory Visit: Payer: Self-pay

## 2023-10-23 ENCOUNTER — Other Ambulatory Visit (HOSPITAL_BASED_OUTPATIENT_CLINIC_OR_DEPARTMENT_OTHER): Payer: Self-pay

## 2023-10-23 DIAGNOSIS — K209 Esophagitis, unspecified without bleeding: Secondary | ICD-10-CM

## 2023-10-23 DIAGNOSIS — K2100 Gastro-esophageal reflux disease with esophagitis, without bleeding: Secondary | ICD-10-CM

## 2023-10-23 MED ORDER — PANTOPRAZOLE SODIUM 40 MG PO TBEC
40.0000 mg | DELAYED_RELEASE_TABLET | Freq: Every day | ORAL | 0 refills | Status: DC
  Filled 2023-10-23: qty 90, 90d supply, fill #0

## 2023-10-27 ENCOUNTER — Other Ambulatory Visit (HOSPITAL_BASED_OUTPATIENT_CLINIC_OR_DEPARTMENT_OTHER): Payer: Self-pay

## 2023-12-18 ENCOUNTER — Other Ambulatory Visit (HOSPITAL_BASED_OUTPATIENT_CLINIC_OR_DEPARTMENT_OTHER): Payer: Self-pay

## 2024-02-11 ENCOUNTER — Other Ambulatory Visit: Payer: Self-pay | Admitting: Internal Medicine

## 2024-02-11 ENCOUNTER — Other Ambulatory Visit: Payer: Self-pay

## 2024-02-11 ENCOUNTER — Other Ambulatory Visit (HOSPITAL_BASED_OUTPATIENT_CLINIC_OR_DEPARTMENT_OTHER): Payer: Self-pay

## 2024-02-11 DIAGNOSIS — K209 Esophagitis, unspecified without bleeding: Secondary | ICD-10-CM

## 2024-02-11 DIAGNOSIS — K2100 Gastro-esophageal reflux disease with esophagitis, without bleeding: Secondary | ICD-10-CM

## 2024-02-12 ENCOUNTER — Other Ambulatory Visit (HOSPITAL_BASED_OUTPATIENT_CLINIC_OR_DEPARTMENT_OTHER): Payer: Self-pay

## 2024-02-12 ENCOUNTER — Encounter (HOSPITAL_BASED_OUTPATIENT_CLINIC_OR_DEPARTMENT_OTHER): Payer: Self-pay

## 2024-02-28 ENCOUNTER — Ambulatory Visit (INDEPENDENT_AMBULATORY_CARE_PROVIDER_SITE_OTHER): Payer: Self-pay | Admitting: Family Medicine

## 2024-02-28 ENCOUNTER — Other Ambulatory Visit (HOSPITAL_BASED_OUTPATIENT_CLINIC_OR_DEPARTMENT_OTHER): Payer: Self-pay

## 2024-02-28 VITALS — BP 110/78 | HR 99 | Temp 97.7°F | Ht 71.0 in | Wt 246.0 lb

## 2024-02-28 DIAGNOSIS — Z79899 Other long term (current) drug therapy: Secondary | ICD-10-CM

## 2024-02-28 DIAGNOSIS — E785 Hyperlipidemia, unspecified: Secondary | ICD-10-CM | POA: Diagnosis not present

## 2024-02-28 DIAGNOSIS — Z131 Encounter for screening for diabetes mellitus: Secondary | ICD-10-CM

## 2024-02-28 DIAGNOSIS — Z125 Encounter for screening for malignant neoplasm of prostate: Secondary | ICD-10-CM | POA: Diagnosis not present

## 2024-02-28 DIAGNOSIS — E669 Obesity, unspecified: Secondary | ICD-10-CM | POA: Diagnosis not present

## 2024-02-28 DIAGNOSIS — K2100 Gastro-esophageal reflux disease with esophagitis, without bleeding: Secondary | ICD-10-CM

## 2024-02-28 DIAGNOSIS — Z0000 Encounter for general adult medical examination without abnormal findings: Secondary | ICD-10-CM

## 2024-02-28 DIAGNOSIS — I10 Essential (primary) hypertension: Secondary | ICD-10-CM | POA: Diagnosis not present

## 2024-02-28 DIAGNOSIS — Z23 Encounter for immunization: Secondary | ICD-10-CM | POA: Diagnosis not present

## 2024-02-28 DIAGNOSIS — K209 Esophagitis, unspecified without bleeding: Secondary | ICD-10-CM

## 2024-02-28 MED ORDER — PANTOPRAZOLE SODIUM 20 MG PO TBEC
20.0000 mg | DELAYED_RELEASE_TABLET | Freq: Every day | ORAL | 3 refills | Status: AC
  Filled 2024-02-28: qty 90, 90d supply, fill #0
  Filled 2024-04-10 – 2024-06-03 (×2): qty 90, 90d supply, fill #1

## 2024-02-28 NOTE — Progress Notes (Signed)
 Phone: [phone]   Subjective:  Patient presents today for their annual physical. Chief complaint-noted.   See problem oriented charting- ROS- full  review of systems was completed and negative  except for topics noted under acute/chronic concerns  The following were reviewed and entered/updated in epic: Past Medical History:  Diagnosis Date   Arthritis    hand   Basal cell carcinoma    back and buttocks   Blood pressure elevated without history of HTN    tx w/hctz   Cancer (HCC)    CHF (congestive heart failure) (HCC)    Diverticulosis    GERD (gastroesophageal reflux disease)    Headache    otc med PRN   History of chicken pox    Had it twice   Internal hemorrhoids    MVP (mitral valve prolapse)    not heard by cardiology- only heard with military physical in 1993   Seasonal allergies    Patient Active Problem List   Diagnosis Date Noted   Diastasis recti 04/19/2022    Priority: Medium    Aortic atherosclerosis 04/07/2022    Priority: Medium    Hyperlipidemia 04/27/2020    Priority: Medium    Stiffness of joints of both hands 02/19/2019    Priority: Medium    GERD (gastroesophageal reflux disease) 11/05/2017    Priority: Medium    Essential hypertension 11/05/2017    Priority: Medium    Carpal tunnel syndrome of right wrist 02/19/2019    Priority: 1.   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   Edema 11/05/2017    Priority: 1.   Past Surgical History:  Procedure Laterality Date   BASAL CELL CARCINOMA EXCISION N/A 2017   From back, lower buttocks   COLONOSCOPY     EYE SURGERY Bilateral    Lasik   WISDOM TOOTH EXTRACTION      Family History  Problem Relation Age of Onset   Hypertension Mother        43 in 2021   Hyperlipidemia Mother    Anxiety disorder Mother    COPD Father        o2 dependent. 82 in 2021. former smoker   Skin cancer Father    Healthy Brother         35 in 2021   Cancer Paternal Aunt        Abdominal   Colon cancer Paternal Uncle    Dementia Maternal Grandmother    Hearing loss Maternal Grandfather    Heart disease Maternal Grandfather        sudden heart attack 65   Cancer Paternal Grandmother        Abdominal Cancer   Heart disease Paternal Grandfather        aortic aneurysm- smoker   Healthy Daughter    Healthy Son    Breast cancer Other        on multiple male family members   Colon polyps Neg Hx    Rectal cancer Neg Hx    Stomach cancer Neg Hx    Esophageal cancer Neg Hx     Medications- reviewed and updated Current Outpatient Medications  Medication Sig Dispense Refill   Cetirizine HCl (ZYRTEC PO) Take by mouth daily.     co-enzyme Q-10 30 MG capsule Take 100 mg by mouth daily.     hydrochlorothiazide  (HYDRODIURIL ) 25 MG tablet Take 1 tablet (25 mg  total) by mouth daily. 90 tablet 3   losartan  (COZAAR ) 50 MG tablet Take 1 tablet (50 mg total) by mouth daily. 90 tablet 3   rosuvastatin  (CRESTOR ) 10 MG tablet Take 1 tablet (10 mg total) by mouth daily. 90 tablet 3   pantoprazole  (PROTONIX ) 20 MG tablet Take 1 tablet (20 mg total) by mouth daily. 90 tablet 3   No current facility-administered medications for this visit.    Allergies-reviewed and updated No Known Allergies  Social History   Social History Narrative   Married 2017. 1 step son. 2 children in their 34s.       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 Oropharynx normal Neck: no thyromegaly CV: RRR no murmurs rubs or gallops Lungs: CTAB no crackles, wheeze, rhonchi Abdomen:  soft/nontender/nondistended/normal bowel sounds. No rebound or guarding.  Ext: no edema Skin: warm, dry Neuro: grossly normal, moves all extremities, PERRLA    Assessment and Plan  58 y.o. male presenting for annual physical.  Health Maintenance counseling: 1. Anticipatory guidance: Patient counseled regarding regular dental exams -q6 months, eye exams -no issues consider baseline at least every 5 yearly,  avoiding smoking and second hand smoke , limiting alcohol to 2 beverages per day - one per month or less, no illicit drugs .   2. Risk factor reduction:  Advised patient of need for regular exercise and diet rich and fruits and vegetables to reduce risk of heart attack and stroke.  Exercise- back to running 30 minutes most days of the week- 6 days- getting 3 miles in .  Diet/weight management-down 7 lbs - has tried to make some improvements just harder with energy needs for running.  Wt Readings from Last 3 Encounters:  02/28/24 246 lb (111.6 kg)  09/06/23 253 lb 3.2 oz (114.9 kg)  08/30/23 254 lb 2 oz (115.3 kg)  3. Immunizations/screenings/ancillary studies- flu shot today , Prevnar 20 waiting until 65 Immunization History  Administered Date(s) Administered   Influenza Inj Mdck Quad Pf 03/02/2019   Influenza, Seasonal, Injecte, Preservative Fre 04/06/2023, 02/28/2024   Influenza,inj,Quad PF,6+ Mos 04/27/2020, 04/07/2022   Influenza-Unspecified 03/10/2019   PFIZER(Purple Top)SARS-COV-2 Vaccination 08/14/2019, 09/08/2019, 03/09/2020   Pfizer(Comirnaty )Fall Seasonal Vaccine 12 years and older 04/06/2023   Tdap 11/05/2017   Zoster Recombinant(Shingrix ) 04/27/2020, 04/07/2022  4. Prostate cancer screening-  low risk prior trend- update psa today  . Family history of prostate cancer-in maternal uncle-he wants to scan annually Lab Results  Component Value Date   PSA 0.96 08/27/2023   PSA 0.65 04/19/2022   PSA 1.23 04/27/2020   5. Colon cancer screening -  Colonoscopy April 2024 with  Dr. Albertus with 5-year repeat. history of adenomatous colon polyp and family history of colon cancer paternal uncle- PRIOR q3 year colonoscopy  6. Skin cancer screening- saw dermatology a month agoadvised regular sunscreen use. Denies worrisome, changing, or new skin lesions.  7. Smoking associated screening (lung cancer screening, AAA screen 65-75, UA)- never smoker 8. STD screening - only active with wife  Status of chronic or acute concerns   #hypertension S:  medication: Hydrochlorothiazide  25 mg, losartan  50 mg BP Readings from Last 3 Encounters:  02/28/24 110/78  09/06/23 122/80  08/30/23 (!) 147/89  A/P: well controlled continue current medications   #hyperlipidemia-CT coronary morphology with Dr. 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 #Aortic atherosclerosis S: Medication:Rosuvastatin  10 mg daily Lab Results  Component Value Date   CHOL 209 (H) 08/27/2023   HDL 47 08/27/2023   LDLCALC 130 (H) 08/27/2023   TRIG 187 (H) 08/27/2023   CHOLHDL 4.4 08/27/2023  A/P: hoping for LDL under 70- update today- continue current medications   #allergies- mild lingering congestion- takes zyrtec and doesn't want to try nasal  #Prior daily headache(s)- was on regular Excedrin but took break and now down to twice a week - mainly gets along with tylenol before bed sparingly- seems to help  #Left shoulder- avoids overuse and avoids laying on side. Home exercises help some- wants to hold off on sports medicine or orthopedic   #GERD- symptom(s) resolved on pantoprazole  40 mg- we are going to try 20 mg and see how he does-but he can message me and I will increase if needed. Prior from Dr. Albertus  #dad with lesion on ureter but not getting biopsy in 80's- we will add UA to his labs  Recommended follow up: Return in about 1 year (around 02/27/2025) for physical or sooner if needed.Schedule b4 you  leave.  Lab/Order associations:NOT fasting   ICD-10-CM   1. Preventative health care  Z00.00     2. Immunization due  Z23 Flu vaccine trivalent PF, 6mos and older(Flulaval ,Afluria,Fluarix,Fluzone)    3. Essential hypertension  I10 Comprehensive metabolic panel with GFR    CBC with Differential/Platelet    Urinalysis, Routine w reflex microscopic    4. Hyperlipidemia, unspecified hyperlipidemia type  E78.5 Comprehensive metabolic panel with GFR    CBC with Differential/Platelet    LDL cholesterol, direct    5. Screening for prostate cancer  Z12.5 PSA    6. Screening for diabetes mellitus  Z13.1 Hemoglobin A1c    7. Obesity (BMI 30-39.9)  E66.9 Hemoglobin A1c    8. High risk medication use  Z79.899 Vitamin B12    9. Acute esophagitis  K20.90 pantoprazole  (PROTONIX ) 20 MG tablet    10. Gastroesophageal reflux disease with esophagitis without hemorrhage  K21.00 pantoprazole  (PROTONIX ) 20 MG tablet      Meds ordered this encounter  Medications   pantoprazole  (PROTONIX ) 20 MG tablet    Sig: Take 1 tablet (20 mg total) by mouth daily.    Dispense:  90 tablet    Refill:  3    Return precautions advised.  Garnette Lukes, MD

## 2024-02-28 NOTE — Patient Instructions (Addendum)
 Please stop by lab before you go If you have mychart- we will send your results within 3 business days of us  receiving them.  If you do not have mychart- we will call you about results within 5 business days of us  receiving them.  *please also note that you will see labs on mychart as soon as they post. I will later go in and write notes on them- will say notes from Dr. Katrinka   Trial lower pantoprazole  dose at 20 mg as long as symptom(s) stay resolved  Thanks for doing flu shot  Recommended follow up: Return in about 1 year (around 02/27/2025) for physical or sooner if needed.Schedule b4 you leave.

## 2024-02-29 ENCOUNTER — Ambulatory Visit: Payer: Self-pay | Admitting: Family Medicine

## 2024-02-29 LAB — URINALYSIS, ROUTINE W REFLEX MICROSCOPIC
Bilirubin Urine: NEGATIVE
Ketones, ur: NEGATIVE
Leukocytes,Ua: NEGATIVE
Nitrite: NEGATIVE
Specific Gravity, Urine: 1.02 (ref 1.000–1.030)
Total Protein, Urine: NEGATIVE
Urine Glucose: NEGATIVE
Urobilinogen, UA: 1 (ref 0.0–1.0)
pH: 6 (ref 5.0–8.0)

## 2024-02-29 LAB — COMPREHENSIVE METABOLIC PANEL WITH GFR
ALT: 21 U/L (ref 0–53)
AST: 20 U/L (ref 0–37)
Albumin: 4.5 g/dL (ref 3.5–5.2)
Alkaline Phosphatase: 68 U/L (ref 39–117)
BUN: 16 mg/dL (ref 6–23)
CO2: 28 meq/L (ref 19–32)
Calcium: 9.7 mg/dL (ref 8.4–10.5)
Chloride: 102 meq/L (ref 96–112)
Creatinine, Ser: 1.05 mg/dL (ref 0.40–1.50)
GFR: 79.55 mL/min (ref >60.00)
Glucose, Bld: 94 mg/dL (ref 70–99)
Potassium: 3.7 meq/L (ref 3.5–5.1)
Sodium: 140 meq/L (ref 135–145)
Total Bilirubin: 0.5 mg/dL (ref 0.2–1.2)
Total Protein: 7.1 g/dL (ref 6.0–8.3)

## 2024-02-29 LAB — CBC WITH DIFFERENTIAL/PLATELET
Basophils Absolute: 0 K/uL (ref 0.0–0.1)
Basophils Relative: 0.5 % (ref 0.0–3.0)
Eosinophils Absolute: 0.1 K/uL (ref 0.0–0.7)
Eosinophils Relative: 1 % (ref 0.0–5.0)
HCT: 47.6 % (ref 39.0–52.0)
Hemoglobin: 16.2 g/dL (ref 13.0–17.0)
Lymphocytes Relative: 21.9 % (ref 12.0–46.0)
Lymphs Abs: 2.1 K/uL (ref 0.7–4.0)
MCHC: 34.1 g/dL (ref 30.0–36.0)
MCV: 86.4 fl (ref 78.0–100.0)
Monocytes Absolute: 0.7 K/uL (ref 0.1–1.0)
Monocytes Relative: 7.5 % (ref 3.0–12.0)
Neutro Abs: 6.6 K/uL (ref 1.4–7.7)
Neutrophils Relative %: 69.1 % (ref 43.0–77.0)
Platelets: 285 K/uL (ref 150.0–400.0)
RBC: 5.51 Mil/uL (ref 4.22–5.81)
RDW: 13.1 % (ref 11.5–15.5)
WBC: 9.6 K/uL (ref 4.0–10.5)

## 2024-02-29 LAB — PSA: PSA: 0.76 ng/mL (ref 0.10–4.00)

## 2024-02-29 LAB — VITAMIN B12: Vitamin B-12: 245 pg/mL (ref 211–911)

## 2024-02-29 LAB — LDL CHOLESTEROL, DIRECT: Direct LDL: 93 mg/dL

## 2024-02-29 LAB — HEMOGLOBIN A1C: Hgb A1c MFr Bld: 6 % (ref 4.6–6.5)

## 2024-03-06 ENCOUNTER — Other Ambulatory Visit: Payer: Self-pay | Admitting: Family Medicine

## 2024-03-06 DIAGNOSIS — R3129 Other microscopic hematuria: Secondary | ICD-10-CM

## 2024-03-10 ENCOUNTER — Other Ambulatory Visit (HOSPITAL_BASED_OUTPATIENT_CLINIC_OR_DEPARTMENT_OTHER): Payer: Self-pay

## 2024-04-10 ENCOUNTER — Other Ambulatory Visit (HOSPITAL_BASED_OUTPATIENT_CLINIC_OR_DEPARTMENT_OTHER): Payer: Self-pay

## 2024-04-22 ENCOUNTER — Other Ambulatory Visit (HOSPITAL_BASED_OUTPATIENT_CLINIC_OR_DEPARTMENT_OTHER): Payer: Self-pay

## 2024-05-21 ENCOUNTER — Other Ambulatory Visit (HOSPITAL_BASED_OUTPATIENT_CLINIC_OR_DEPARTMENT_OTHER): Payer: Self-pay

## 2024-06-03 ENCOUNTER — Other Ambulatory Visit: Payer: Self-pay | Admitting: Family Medicine

## 2024-06-03 ENCOUNTER — Other Ambulatory Visit: Payer: Self-pay

## 2024-06-04 ENCOUNTER — Other Ambulatory Visit (HOSPITAL_BASED_OUTPATIENT_CLINIC_OR_DEPARTMENT_OTHER): Payer: Self-pay

## 2024-06-04 MED ORDER — ROSUVASTATIN CALCIUM 10 MG PO TABS
10.0000 mg | ORAL_TABLET | Freq: Every day | ORAL | 3 refills | Status: AC
  Filled 2024-06-04: qty 90, 90d supply, fill #0

## 2024-06-04 MED ORDER — LOSARTAN POTASSIUM 50 MG PO TABS
50.0000 mg | ORAL_TABLET | Freq: Every day | ORAL | 3 refills | Status: AC
  Filled 2024-06-04: qty 90, 90d supply, fill #0

## 2024-06-04 MED ORDER — HYDROCHLOROTHIAZIDE 25 MG PO TABS
25.0000 mg | ORAL_TABLET | Freq: Every day | ORAL | 3 refills | Status: AC
  Filled 2024-06-04: qty 90, 90d supply, fill #0

## 2024-06-06 ENCOUNTER — Other Ambulatory Visit (HOSPITAL_BASED_OUTPATIENT_CLINIC_OR_DEPARTMENT_OTHER): Payer: Self-pay

## 2024-06-06 MED ORDER — MELOXICAM 15 MG PO TABS
15.0000 mg | ORAL_TABLET | Freq: Every day | ORAL | 0 refills | Status: AC
  Filled 2024-06-06: qty 30, 30d supply, fill #0

## 2024-06-07 ENCOUNTER — Other Ambulatory Visit (HOSPITAL_BASED_OUTPATIENT_CLINIC_OR_DEPARTMENT_OTHER): Payer: Self-pay

## 2025-03-02 ENCOUNTER — Encounter: Admitting: Family Medicine
